# Patient Record
Sex: Male | Born: 1945 | Race: White | Hispanic: No | Marital: Married | Smoking: Never smoker
Health system: Southern US, Community
[De-identification: ages and names within clinical notes are randomized; demographics above are authoritative.]

## PROBLEM LIST (undated history)

## (undated) DIAGNOSIS — M109 Gout, unspecified: Secondary | ICD-10-CM

## (undated) DIAGNOSIS — N400 Enlarged prostate without lower urinary tract symptoms: Secondary | ICD-10-CM

## (undated) DIAGNOSIS — I10 Essential (primary) hypertension: Secondary | ICD-10-CM

## (undated) DIAGNOSIS — K219 Gastro-esophageal reflux disease without esophagitis: Secondary | ICD-10-CM

## (undated) DIAGNOSIS — D649 Anemia, unspecified: Secondary | ICD-10-CM

## (undated) DIAGNOSIS — E119 Type 2 diabetes mellitus without complications: Secondary | ICD-10-CM

## (undated) DIAGNOSIS — E785 Hyperlipidemia, unspecified: Secondary | ICD-10-CM

## (undated) HISTORY — DX: Essential (primary) hypertension: I10

## (undated) HISTORY — DX: Gout, unspecified: M10.9

## (undated) HISTORY — DX: Hyperlipidemia, unspecified: E78.5

## (undated) HISTORY — DX: Anemia, unspecified: D64.9

## (undated) HISTORY — DX: Gastro-esophageal reflux disease without esophagitis: K21.9

## (undated) HISTORY — DX: Type 2 diabetes mellitus without complications: E11.9

## (undated) HISTORY — DX: Benign prostatic hyperplasia without lower urinary tract symptoms: N40.0

---

## 2001-05-24 HISTORY — PX: CORONARY ARTERY BYPASS GRAFT: SHX141

## 2001-07-07 ENCOUNTER — Ambulatory Visit (HOSPITAL_COMMUNITY): Admission: RE | Admit: 2001-07-07 | Discharge: 2001-07-08 | Payer: Self-pay | Admitting: Cardiovascular Disease

## 2001-07-10 ENCOUNTER — Ambulatory Visit (HOSPITAL_COMMUNITY): Admission: RE | Admit: 2001-07-10 | Discharge: 2001-07-12 | Payer: Self-pay | Admitting: Cardiovascular Disease

## 2001-07-14 ENCOUNTER — Encounter: Payer: Self-pay | Admitting: *Deleted

## 2001-07-14 ENCOUNTER — Inpatient Hospital Stay (HOSPITAL_COMMUNITY): Admission: EM | Admit: 2001-07-14 | Discharge: 2001-07-18 | Payer: Self-pay | Admitting: *Deleted

## 2002-02-09 ENCOUNTER — Ambulatory Visit (HOSPITAL_COMMUNITY): Admission: RE | Admit: 2002-02-09 | Discharge: 2002-02-09 | Payer: Self-pay | Admitting: Cardiology

## 2002-02-13 ENCOUNTER — Ambulatory Visit (HOSPITAL_COMMUNITY): Admission: RE | Admit: 2002-02-13 | Discharge: 2002-02-13 | Payer: Self-pay | Admitting: Cardiology

## 2002-02-21 ENCOUNTER — Encounter: Payer: Self-pay | Admitting: Surgery

## 2002-02-23 ENCOUNTER — Encounter: Payer: Self-pay | Admitting: Surgery

## 2002-02-23 ENCOUNTER — Inpatient Hospital Stay (HOSPITAL_COMMUNITY): Admission: RE | Admit: 2002-02-23 | Discharge: 2002-02-28 | Payer: Self-pay | Admitting: Surgery

## 2002-02-24 ENCOUNTER — Encounter: Payer: Self-pay | Admitting: Surgery

## 2002-02-25 ENCOUNTER — Encounter: Payer: Self-pay | Admitting: Cardiothoracic Surgery

## 2002-02-26 ENCOUNTER — Encounter: Payer: Self-pay | Admitting: Cardiothoracic Surgery

## 2002-03-15 ENCOUNTER — Inpatient Hospital Stay (HOSPITAL_COMMUNITY): Admission: EM | Admit: 2002-03-15 | Discharge: 2002-03-17 | Payer: Self-pay | Admitting: Emergency Medicine

## 2002-03-15 ENCOUNTER — Encounter: Payer: Self-pay | Admitting: Cardiology

## 2002-03-17 ENCOUNTER — Encounter: Payer: Self-pay | Admitting: Cardiology

## 2002-04-14 ENCOUNTER — Encounter (INDEPENDENT_AMBULATORY_CARE_PROVIDER_SITE_OTHER): Payer: Self-pay | Admitting: *Deleted

## 2002-04-14 ENCOUNTER — Inpatient Hospital Stay (HOSPITAL_COMMUNITY): Admission: EM | Admit: 2002-04-14 | Discharge: 2002-04-19 | Payer: Self-pay

## 2002-04-14 ENCOUNTER — Encounter: Payer: Self-pay | Admitting: Internal Medicine

## 2002-04-16 ENCOUNTER — Encounter: Payer: Self-pay | Admitting: Internal Medicine

## 2002-04-16 ENCOUNTER — Encounter (INDEPENDENT_AMBULATORY_CARE_PROVIDER_SITE_OTHER): Payer: Self-pay | Admitting: *Deleted

## 2002-04-16 ENCOUNTER — Encounter: Payer: Self-pay | Admitting: Cardiology

## 2002-04-20 ENCOUNTER — Emergency Department (HOSPITAL_COMMUNITY): Admission: EM | Admit: 2002-04-20 | Discharge: 2002-04-20 | Payer: Self-pay | Admitting: Unknown Physician Specialty

## 2002-04-20 ENCOUNTER — Encounter: Payer: Self-pay | Admitting: Cardiology

## 2002-12-18 ENCOUNTER — Ambulatory Visit (HOSPITAL_COMMUNITY): Admission: RE | Admit: 2002-12-18 | Discharge: 2002-12-18 | Payer: Self-pay | Admitting: Unknown Physician Specialty

## 2002-12-18 ENCOUNTER — Encounter: Payer: Self-pay | Admitting: Unknown Physician Specialty

## 2003-02-05 ENCOUNTER — Encounter: Payer: Self-pay | Admitting: Unknown Physician Specialty

## 2003-02-05 ENCOUNTER — Ambulatory Visit (HOSPITAL_COMMUNITY): Admission: RE | Admit: 2003-02-05 | Discharge: 2003-02-05 | Payer: Self-pay | Admitting: Unknown Physician Specialty

## 2003-03-25 HISTORY — PX: COLONOSCOPY: SHX174

## 2003-10-01 ENCOUNTER — Ambulatory Visit (HOSPITAL_COMMUNITY): Admission: RE | Admit: 2003-10-01 | Discharge: 2003-10-01 | Payer: Self-pay | Admitting: Unknown Physician Specialty

## 2003-10-24 ENCOUNTER — Ambulatory Visit (HOSPITAL_COMMUNITY): Admission: RE | Admit: 2003-10-24 | Discharge: 2003-10-24 | Payer: Self-pay | Admitting: Unknown Physician Specialty

## 2003-10-29 ENCOUNTER — Ambulatory Visit (HOSPITAL_COMMUNITY): Admission: RE | Admit: 2003-10-29 | Discharge: 2003-10-29 | Payer: Self-pay | Admitting: Unknown Physician Specialty

## 2003-11-06 ENCOUNTER — Ambulatory Visit (HOSPITAL_COMMUNITY): Admission: RE | Admit: 2003-11-06 | Discharge: 2003-11-06 | Payer: Self-pay | Admitting: Unknown Physician Specialty

## 2004-01-21 ENCOUNTER — Ambulatory Visit (HOSPITAL_COMMUNITY): Admission: RE | Admit: 2004-01-21 | Discharge: 2004-01-21 | Payer: Self-pay | Admitting: Unknown Physician Specialty

## 2004-04-27 ENCOUNTER — Ambulatory Visit: Payer: Self-pay | Admitting: Family Medicine

## 2004-10-02 ENCOUNTER — Emergency Department (HOSPITAL_COMMUNITY): Admission: EM | Admit: 2004-10-02 | Discharge: 2004-10-02 | Payer: Self-pay | Admitting: Emergency Medicine

## 2004-10-08 ENCOUNTER — Ambulatory Visit: Payer: Self-pay | Admitting: Family Medicine

## 2004-11-19 IMAGING — CR DG TIBIA/FIBULA 2V*R*
4 series · 4 of 4 positions shown · non-contrast
Comparison: none

[view not recorded (1 of 4)]
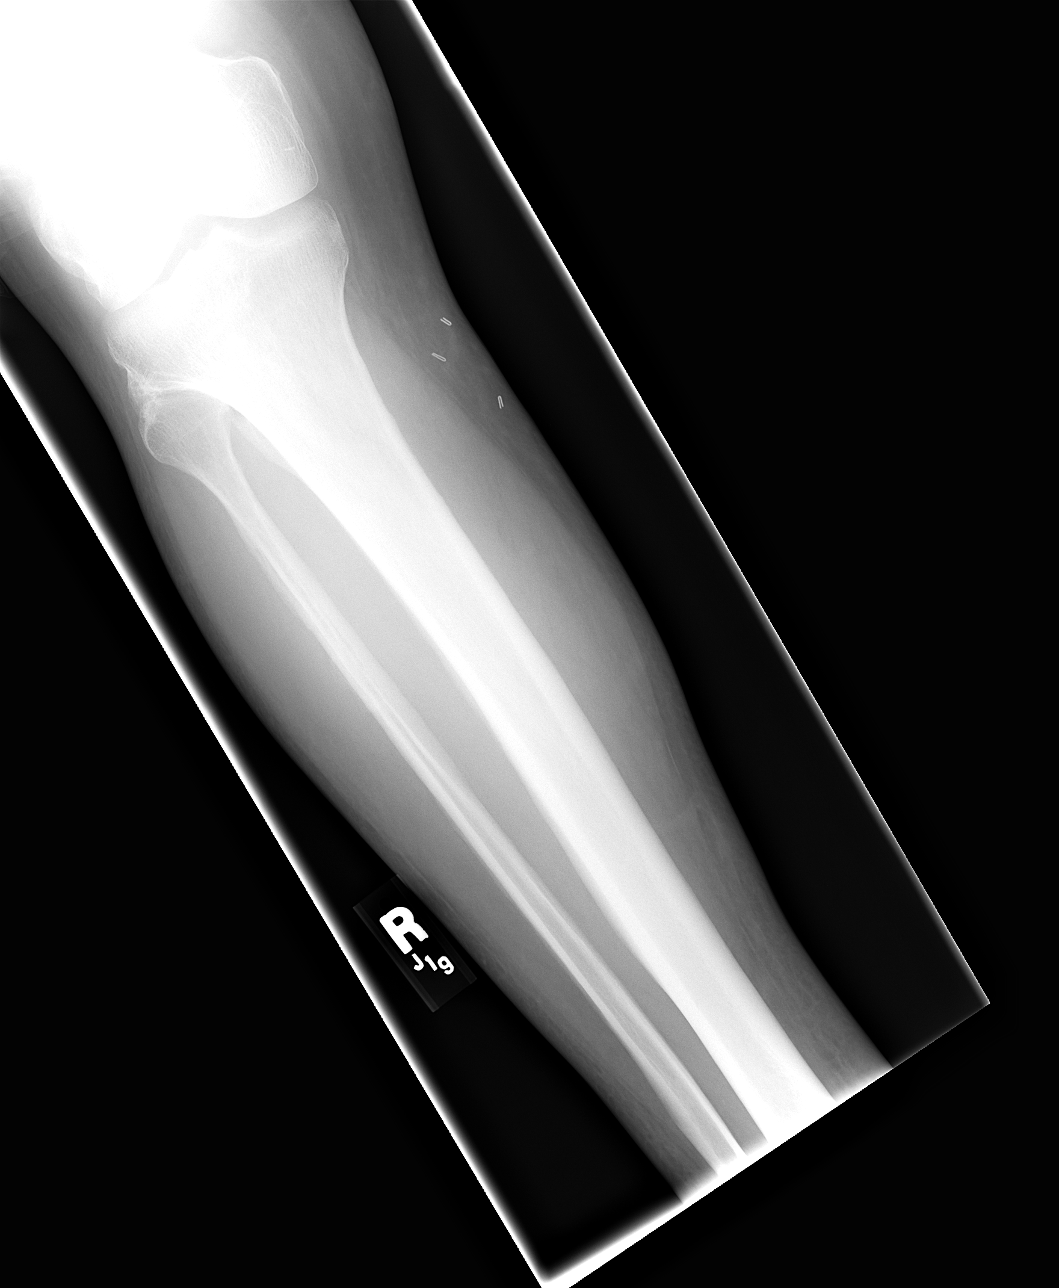

[view not recorded (2 of 4)]
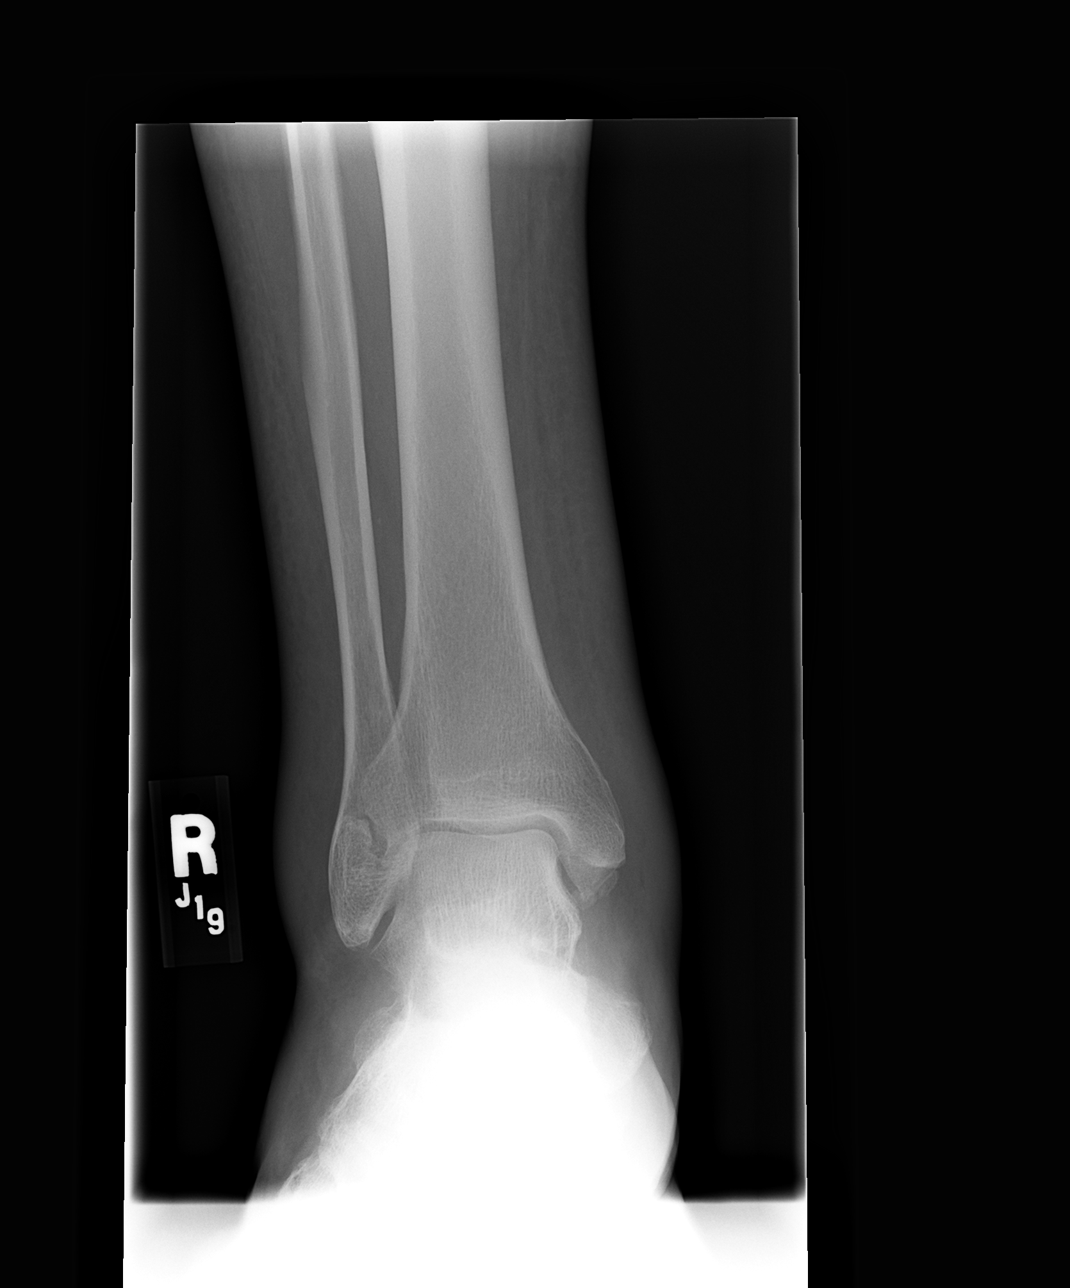

[view not recorded (3 of 4)]
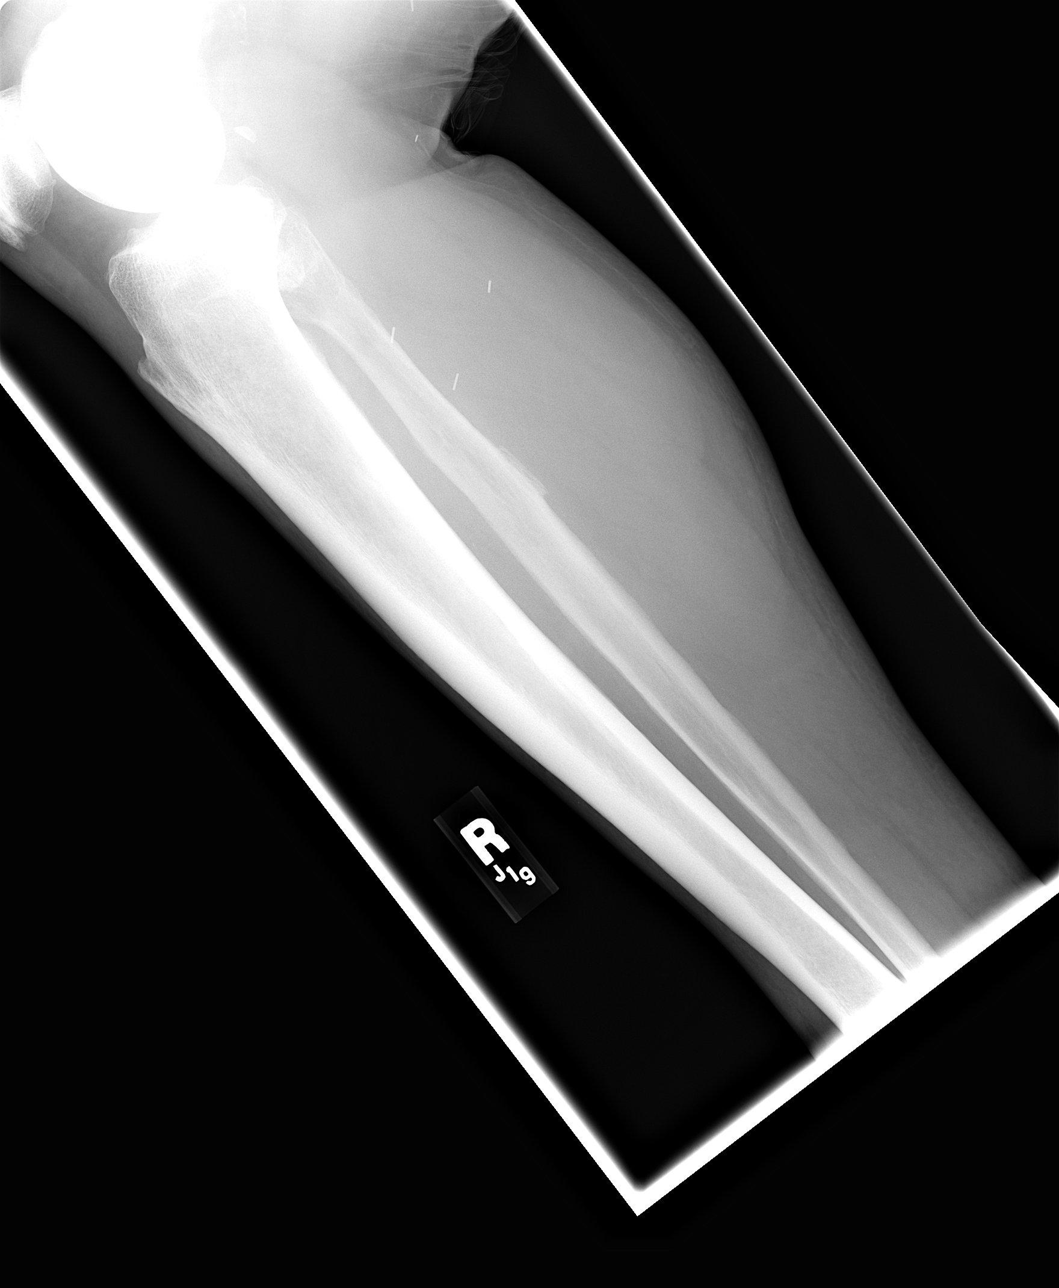

[view not recorded (4 of 4)]
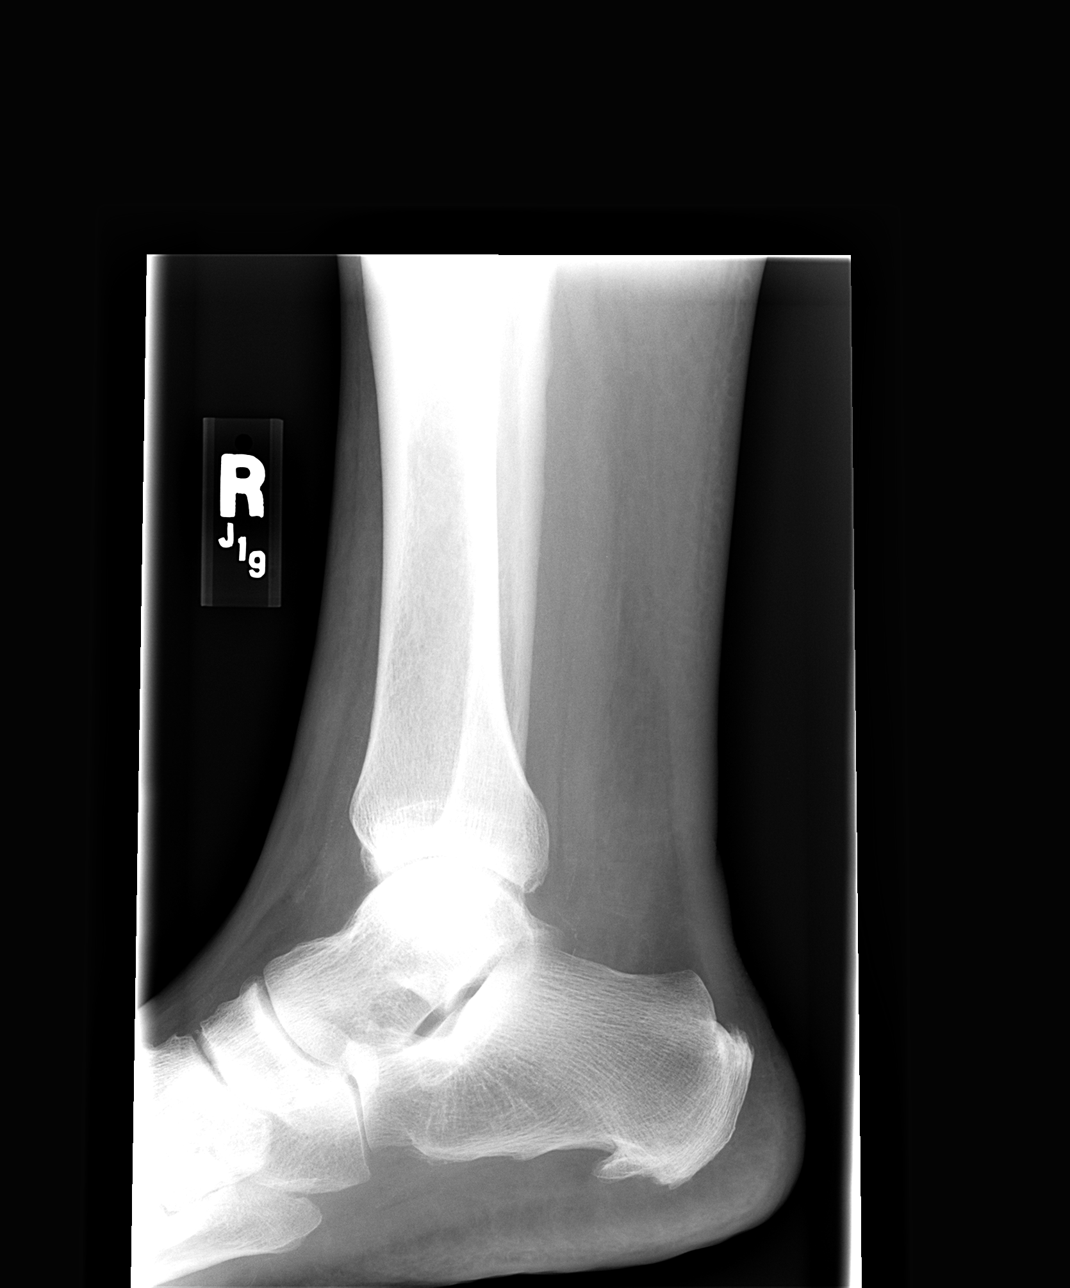

[4 of 4 positions shown; findings below may reference images not displayed]

<!--  IDXRADR:ADDEND:BEGIN -->Addendum Begins<!--  IDXRADR:ADDEND:INNER_BEGIN -->ADDENDUM:  I spoke with Dr. Tabaka by telephone today (11/20/03), and he confirmed that the patient?s injury was indeed in the proximal lower leg and that the soft tissue swelling about the ankle was secondary to this.  I do believe that the lucent line in the distal fibula is artifactual and probably related to a mock effect.  Dr. Urena

 <!--  IDXRADR:ADDEND:INNER_END -->Addendum Ends
<!--  IDXRADR:ADDEND:END -->Clinical Data:   Knot on medial /anterior lower leg following injury.
RIGHT TIBIA/FIBULA
Two views show no definite fracture or acute bony abnormality.  There is a lucency seen on the PA view at the tibiofibular junction, which I think is an artifact.  There is also a thin linear line above this area that is likely to be a vascular groove.  An occult fracture of the distal fibula cannot be excluded.  However, based on the history, it sounds like the injury was more proximal.  This needs to be clinically correlated.  There is generalized soft tissue swelling around the ankle.  
Heavy calcaneal spurring incidentally noted.
IMPRESSION
Soft tissue swelling-cannot rule out occult fracture of the distal fibula (doubt).

## 2005-05-11 ENCOUNTER — Ambulatory Visit: Payer: Self-pay | Admitting: Family Medicine

## 2005-05-13 ENCOUNTER — Ambulatory Visit: Payer: Self-pay | Admitting: Family Medicine

## 2006-06-28 ENCOUNTER — Ambulatory Visit: Payer: Self-pay | Admitting: Family Medicine

## 2006-12-27 ENCOUNTER — Ambulatory Visit (HOSPITAL_COMMUNITY): Admission: RE | Admit: 2006-12-27 | Discharge: 2006-12-27 | Payer: Self-pay | Admitting: Family Medicine

## 2008-08-22 ENCOUNTER — Emergency Department (HOSPITAL_COMMUNITY): Admission: EM | Admit: 2008-08-22 | Discharge: 2008-08-22 | Payer: Self-pay | Admitting: Emergency Medicine

## 2008-09-05 ENCOUNTER — Ambulatory Visit: Payer: Self-pay | Admitting: Surgery

## 2008-09-10 ENCOUNTER — Ambulatory Visit (HOSPITAL_COMMUNITY): Admission: RE | Admit: 2008-09-10 | Discharge: 2008-09-10 | Payer: Self-pay | Admitting: Surgery

## 2008-09-12 ENCOUNTER — Ambulatory Visit: Payer: Self-pay | Admitting: Surgery

## 2008-09-25 ENCOUNTER — Telehealth (INDEPENDENT_AMBULATORY_CARE_PROVIDER_SITE_OTHER): Payer: Self-pay | Admitting: *Deleted

## 2008-09-26 ENCOUNTER — Encounter: Payer: Self-pay | Admitting: Surgery

## 2008-09-26 ENCOUNTER — Inpatient Hospital Stay (HOSPITAL_COMMUNITY): Admission: RE | Admit: 2008-09-26 | Discharge: 2008-09-30 | Payer: Self-pay | Admitting: Surgery

## 2008-09-26 ENCOUNTER — Ambulatory Visit: Payer: Self-pay | Admitting: Surgery

## 2008-10-04 ENCOUNTER — Ambulatory Visit: Payer: Self-pay | Admitting: Surgery

## 2008-10-22 ENCOUNTER — Encounter: Admission: RE | Admit: 2008-10-22 | Discharge: 2008-10-22 | Payer: Self-pay | Admitting: Surgery

## 2008-10-22 ENCOUNTER — Ambulatory Visit: Payer: Self-pay | Admitting: Surgery

## 2009-04-29 ENCOUNTER — Encounter: Admission: RE | Admit: 2009-04-29 | Discharge: 2009-04-29 | Payer: Self-pay | Admitting: Surgery

## 2009-04-29 ENCOUNTER — Ambulatory Visit: Payer: Self-pay | Admitting: Surgery

## 2009-05-24 HISTORY — PX: LUNG LOBECTOMY: SHX167

## 2009-11-13 IMAGING — CR DG CHEST 2V
2 series · 2 of 2 positions shown · non-contrast
Comparison: Chest x-ray of 09/30/2008

CLINICAL DATA: History of left lung surgery 09/26/2008, follow-up

CHEST - 2 VIEW

[w chest pa]
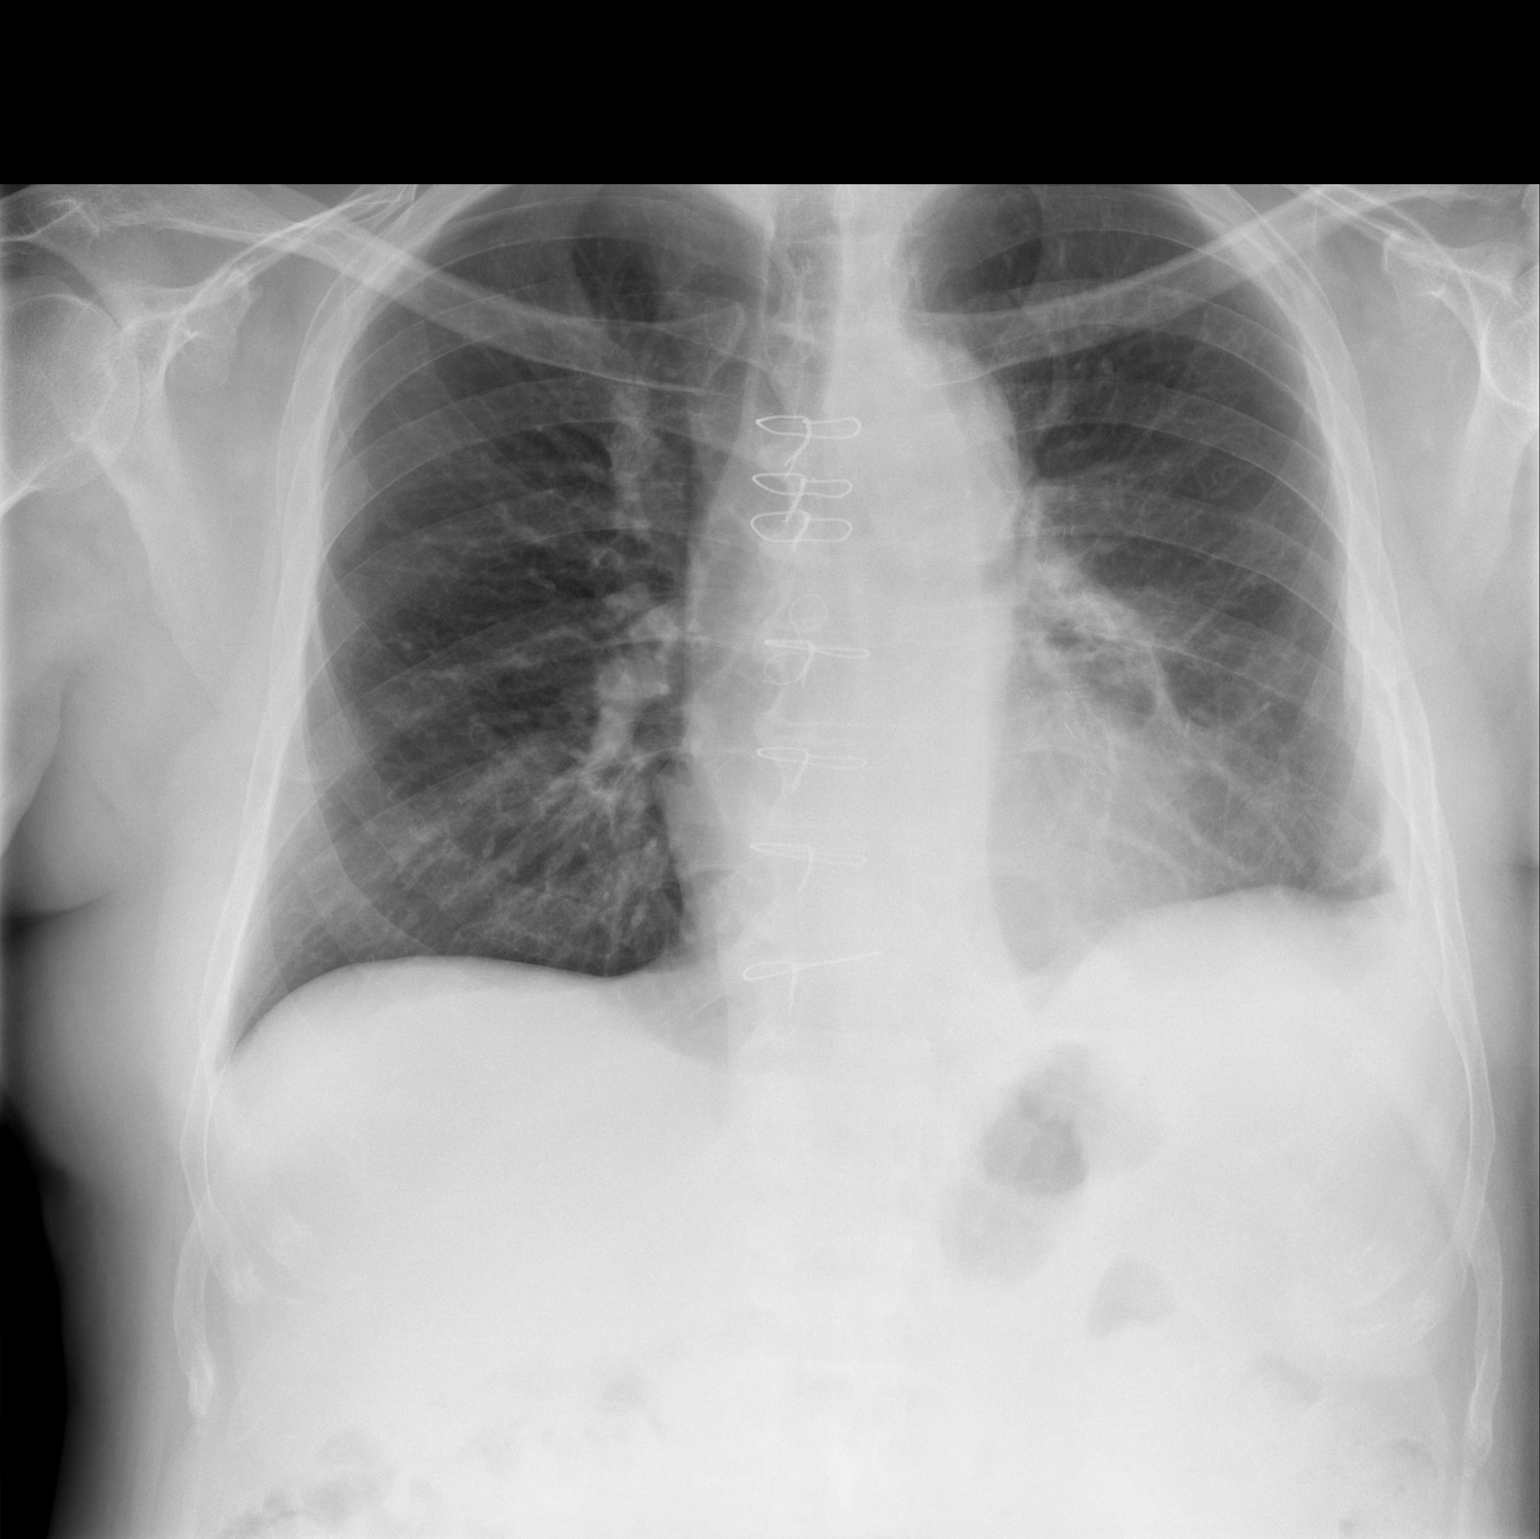

[w chest lat]
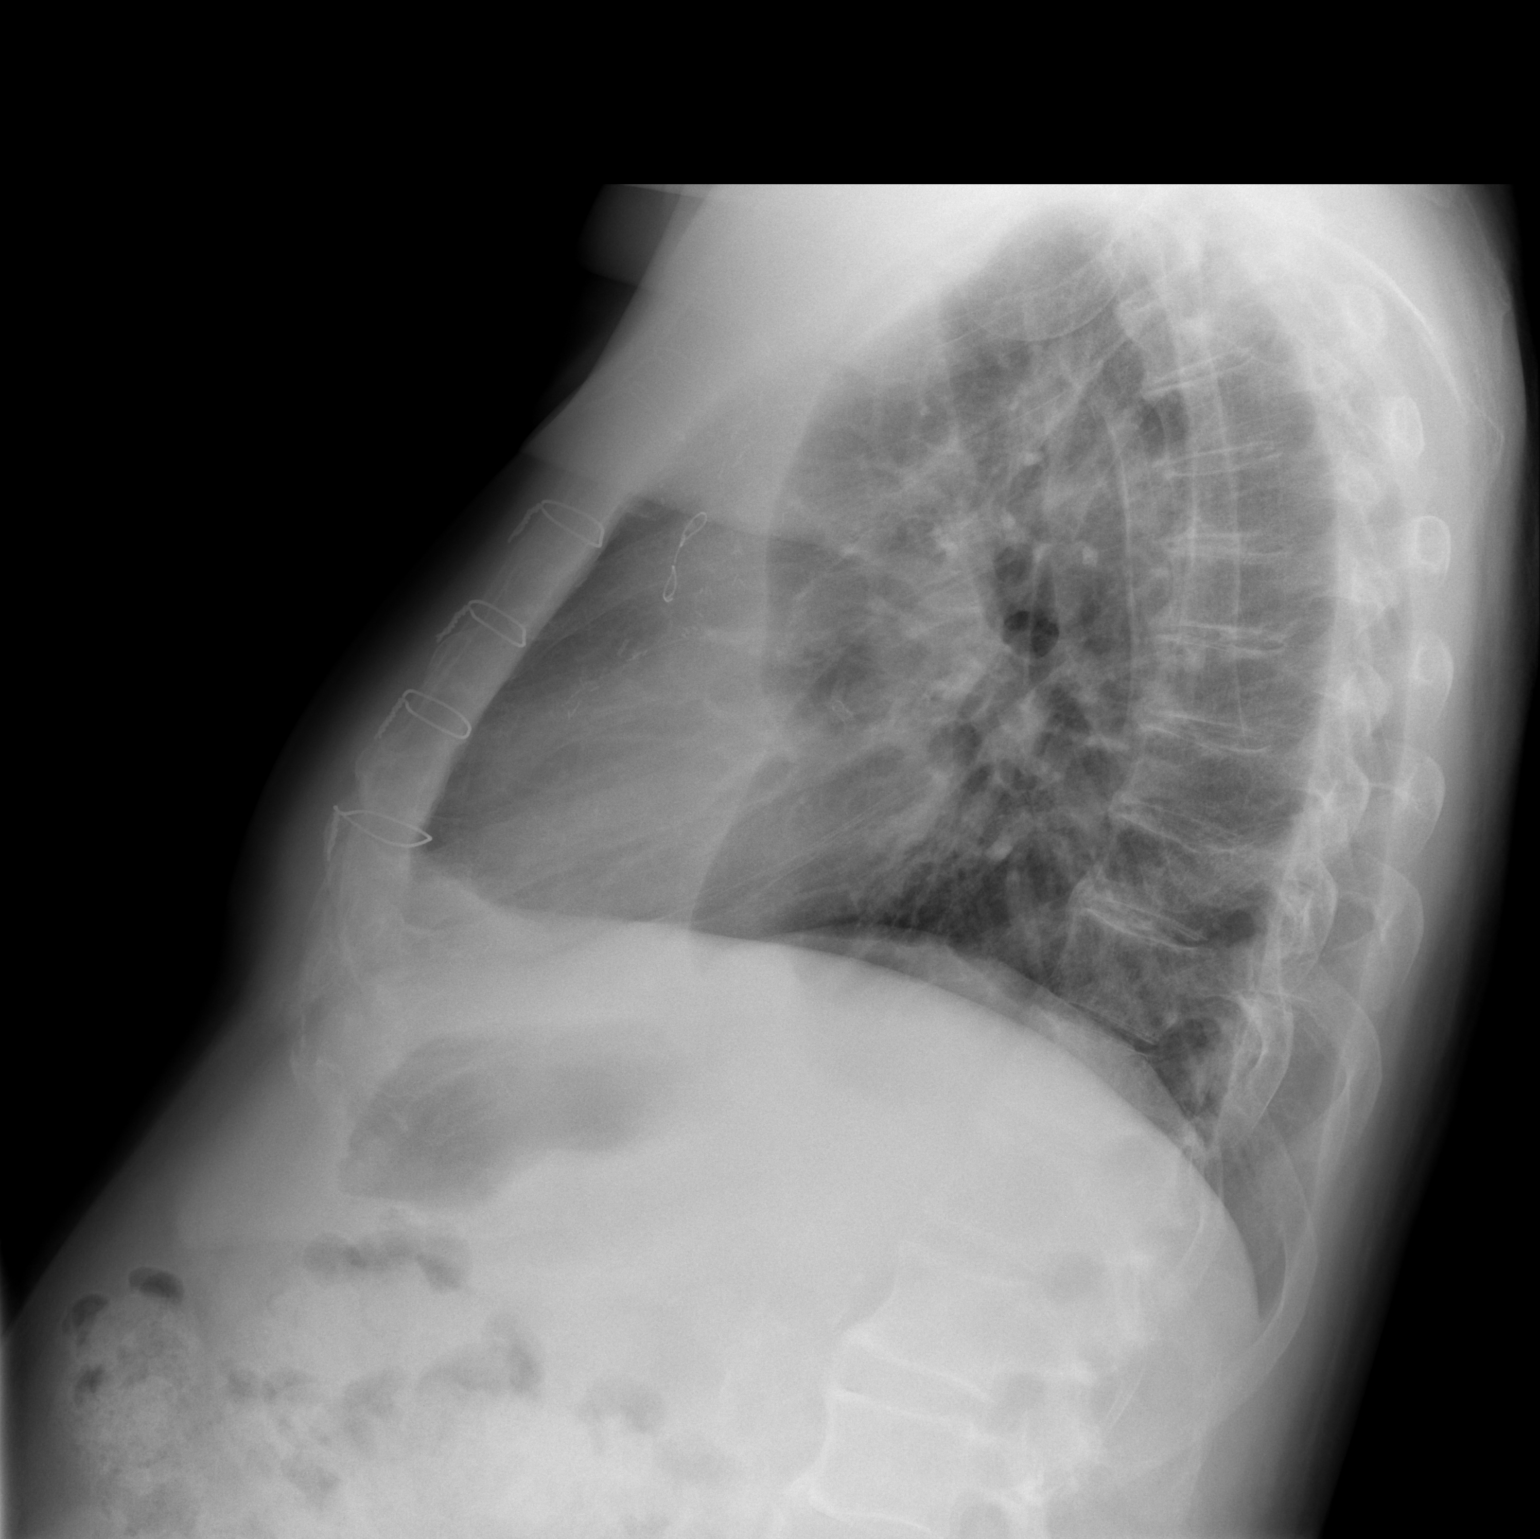

[2 of 2 positions shown; findings below may reference images not displayed]

FINDINGS: Aeration has improved with decreasing pleural and
parenchymal opacity at the left lung base.  The right lung is
clear.  Heart size is stable.  Median sternotomy sutures are noted.
IMPRESSION: Improved aeration on the left with minimal residual pleural and
parenchymal opacity.

## 2010-04-28 ENCOUNTER — Ambulatory Visit: Payer: Self-pay | Admitting: Surgery

## 2010-04-28 ENCOUNTER — Encounter: Admission: RE | Admit: 2010-04-28 | Discharge: 2010-04-28 | Payer: Self-pay | Admitting: Surgery

## 2010-06-13 ENCOUNTER — Encounter: Payer: Self-pay | Admitting: Emergency Medicine

## 2010-09-01 LAB — COMPREHENSIVE METABOLIC PANEL
BUN: 24 mg/dL — ABNORMAL HIGH (ref 6–23)
CO2: 24 mEq/L (ref 19–32)
Chloride: 109 mEq/L (ref 96–112)
Creatinine, Ser: 1.17 mg/dL (ref 0.4–1.5)
GFR calc non Af Amer: >60 mL/min (ref >60)
Glucose, Bld: 97 mg/dL (ref 70–99)

## 2010-09-01 LAB — CBC
Hemoglobin: 14.1 g/dL (ref 13.0–17.0)
MCHC: 34.5 g/dL (ref 30.0–36.0)
MCHC: 34.8 g/dL (ref 30.0–36.0)
Platelets: 139 10*3/uL — ABNORMAL LOW (ref 150–400)
Platelets: 152 10*3/uL (ref 150–400)
RDW: 13.8 % (ref 11.5–15.5)
RDW: 13.9 % (ref 11.5–15.5)

## 2010-09-01 LAB — BASIC METABOLIC PANEL
BUN: 24 mg/dL — ABNORMAL HIGH (ref 6–23)
CO2: 25 mEq/L (ref 19–32)
CO2: 26 mEq/L (ref 19–32)
Calcium: 8.2 mg/dL — ABNORMAL LOW (ref 8.4–10.5)
Chloride: 105 mEq/L (ref 96–112)
Creatinine, Ser: 1.16 mg/dL (ref 0.4–1.5)
Creatinine, Ser: 1.21 mg/dL (ref 0.4–1.5)
GFR calc Af Amer: >60 mL/min (ref >60)
GFR calc Af Amer: >60 mL/min (ref >60)
GFR calc non Af Amer: >60 mL/min (ref >60)
Glucose, Bld: 153 mg/dL — ABNORMAL HIGH (ref 70–99)
Glucose, Bld: 93 mg/dL (ref 70–99)
Sodium: 140 mEq/L (ref 135–145)

## 2010-09-01 LAB — BLOOD GAS, ARTERIAL
Acid-base deficit: 1.7 mmol/L (ref 0.0–2.0)
Drawn by: 206361
FIO2: 0.21 %
O2 Saturation: 96.7 %
Patient temperature: 98.6
TCO2: 23.6 mmol/L (ref 0–100)
pH, Arterial: 7.393 (ref 7.350–7.450)
pO2, Arterial: 87.6 mmHg (ref 80.0–100.0)

## 2010-09-01 LAB — GLUCOSE, CAPILLARY
Glucose-Capillary: 101 mg/dL — ABNORMAL HIGH (ref 70–99)
Glucose-Capillary: 107 mg/dL — ABNORMAL HIGH (ref 70–99)
Glucose-Capillary: 134 mg/dL — ABNORMAL HIGH (ref 70–99)
Glucose-Capillary: 140 mg/dL — ABNORMAL HIGH (ref 70–99)
Glucose-Capillary: 157 mg/dL — ABNORMAL HIGH (ref 70–99)
Glucose-Capillary: 184 mg/dL — ABNORMAL HIGH (ref 70–99)
Glucose-Capillary: 191 mg/dL — ABNORMAL HIGH (ref 70–99)
Glucose-Capillary: 228 mg/dL — ABNORMAL HIGH (ref 70–99)
Glucose-Capillary: 283 mg/dL — ABNORMAL HIGH (ref 70–99)
Glucose-Capillary: 75 mg/dL (ref 70–99)
Glucose-Capillary: 96 mg/dL (ref 70–99)

## 2010-09-01 LAB — PROTIME-INR: Prothrombin Time: 13.3 seconds (ref 11.6–15.2)

## 2010-09-01 LAB — URINALYSIS, ROUTINE W REFLEX MICROSCOPIC
Bilirubin Urine: NEGATIVE
Nitrite: NEGATIVE
Specific Gravity, Urine: 1.013 (ref 1.005–1.030)
Urobilinogen, UA: 0.2 mg/dL (ref 0.0–1.0)
pH: 5.5 (ref 5.0–8.0)

## 2010-09-01 LAB — TYPE AND SCREEN: Antibody Screen: NEGATIVE

## 2010-09-02 LAB — DIFFERENTIAL
Basophils Absolute: 0 10*3/uL (ref 0.0–0.1)
Eosinophils Absolute: 0.1 10*3/uL (ref 0.0–0.7)
Eosinophils Relative: 2 % (ref 0–5)
Monocytes Absolute: 0.3 10*3/uL (ref 0.1–1.0)

## 2010-09-02 LAB — CBC
HCT: 40.2 % (ref 39.0–52.0)
MCV: 87.4 fL (ref 78.0–100.0)
Platelets: 130 10*3/uL — ABNORMAL LOW (ref 150–400)
RDW: 13.9 % (ref 11.5–15.5)

## 2010-09-02 LAB — GLUCOSE, CAPILLARY: Glucose-Capillary: 148 mg/dL — ABNORMAL HIGH (ref 70–99)

## 2010-09-02 LAB — BASIC METABOLIC PANEL
BUN: 23 mg/dL (ref 6–23)
CO2: 25 mEq/L (ref 19–32)
Chloride: 104 mEq/L (ref 96–112)
Glucose, Bld: 104 mg/dL — ABNORMAL HIGH (ref 70–99)
Potassium: 4.2 mEq/L (ref 3.5–5.1)

## 2010-09-02 LAB — URINALYSIS, ROUTINE W REFLEX MICROSCOPIC
Bilirubin Urine: NEGATIVE
Glucose, UA: NEGATIVE mg/dL
Hgb urine dipstick: NEGATIVE
Ketones, ur: NEGATIVE mg/dL
pH: 6 (ref 5.0–8.0)

## 2010-09-02 LAB — POCT CARDIAC MARKERS
Myoglobin, poc: 64.4 ng/mL (ref 12–200)
Myoglobin, poc: 82.6 ng/mL (ref 12–200)
Troponin i, poc: <0.05 ng/mL (ref 0.00–0.09)

## 2010-09-02 LAB — POCT I-STAT, CHEM 8
Calcium, Ion: 1.17 mmol/L (ref 1.12–1.32)
Creatinine, Ser: 1.4 mg/dL (ref 0.4–1.5)
Hemoglobin: 14.6 g/dL (ref 13.0–17.0)
Sodium: 140 mEq/L (ref 135–145)
TCO2: 26 mmol/L (ref 0–100)

## 2010-10-06 NOTE — Op Note (Signed)
[name], PALMERO NO.:  [id number]   MEDICAL RECORD NO.:  [id number]          PATIENT TYPE:  INP   LOCATION:  2313                         FACILITY:  [facility]   PHYSICIAN:  Evelene Croon, M.D.     DATE OF BIRTH:  03-22-1946   DATE OF PROCEDURE:  09/26/2008  DATE OF DISCHARGE:                               OPERATIVE REPORT   PREOPERATIVE DIAGNOSIS:  Left upper lobe lung mass.   POSTOPERATIVE DIAGNOSIS:  Left upper lobe lung mass.   PROCEDURES:  1. Left thoracotomy.  2. Wedge resection of left upper lobe lung mass.   SURGEON:  Evelene Croon, MD   ASSISTANT:  Rowe Clack, PA-C   ANESTHESIA:  General endotracheal.   CLINICAL HISTORY:  This patient is a 65 year old gentleman who I had  previously performed coronary artery bypass graft surgery x5 in October  2003.  He has done well since then.  He recently developed some nausea  and dizziness and was seen in the [facility] Emergency Room where a CT  scan of the head was negative.  Chest x-ray showed a suggestion of a  left upper lobe lung mass.  He subsequently had a CT scan of the chest  which showed a 2 x 2.8 cm mass in the anterior segment of left upper  lobe with some involvement of the underlying pleura.  There was no  evidence of chest wall invasion.  This was suspicious for a primary  neoplasm.  There are no pathologically enlarged mediastinal or hilar  lymph nodes.  His nausea and dizziness completely resolved.  He was  referred to my office and we obtained a PET scan, which showed that the  left upper lobe lung lesion to have a SUV of 2.7.  It was felt this is  either inflammatory or possibly a bronchoalveolar cell carcinoma.  He  had PFT which showed mild reduction of his FEV-1 of 2.66 which was 73%  of predicted and reduction in his FVC of 3.2 to 3.39 which was 70% of  predicted.  Diffusion capacity was 97%.  After review of all of his  studies, I discussed the options with him including obtaining  a  percutaneous needle biopsy, I continued observation with followup CT  scan or surgical resection.  He decided that the best option for him  would be to proceed with surgical resection of this lesion.  I discussed  the operative procedure with he and his wife including alternatives,  benefits, and risks including but not limited to bleeding, blood  transfusion, injury to the lung, injury to the left internal mammary  graft which was felt to be lying adjacent to this area, prolonged air  leak, and space problem.  He understood all this and agreed to proceed.   OPERATIVE PROCEDURE:  The patient was taken to the operative room and  placed on table in a supine position.  He had been seen in the holding  area and the left side of the chest was signed by me after confirming  that was the proper patient, proper operative side,  and proper  operation.  After induction of general endotracheal anesthesia,  pneumatic compression devices were placed on both lower legs.  Foley  catheter was placed in bladder.  The patient was then turned into the  right lateral decubitus position with left side up.  The left side of  the chest was prepped with Betadine soap solution and draped in usual  sterile manner.  A time-out was taken and the proper patient, proper  operative side, proper operation were confirmed with nursing and  anesthesia staff.  Then, the left chest was opened through a lateral  thoracotomy incision.  Initially, we tried to do a muscle-sparing  incision and entered the pleural space through the fourth intercostal  space.  Unfortunately, the left upper lobe lung was firmly adherent to  the apex of the chest as well as to the mediastinal pleura over the area  where the left internal mammary pedicle was entering the pericardium.  This area was very difficult to see through this limited incision; and  therefore, I felt it was necessary to extend the incision by dividing  the latissimus and  serratus muscles.  This provided good exposure of the  area.  Palpation of the left upper lobe showed that there was some  thickness in the area in question on CT scan, but no definite mass.  There was overlying scarring.  This area was firmly adherent to the  mediastinal pleura.  Then, the lung was dissected off the mediastinal  pleura and apex of the chest using sharp scissors dissection.  The left  internal mammary pedicle was not injured.  After mobilizing the left  upper lobe, there were no further lesions visible or palpated.  Left  lower lobe appeared unremarkable.  I felt the best option would be to  perform a wedge resection of the area in question which was essentially  the lingular segment of the left upper lobe.  This was performed using  linear cutting staplers.  All the visibly disease lung was removed and  sent to pathology for permanent section.  The staple line was lightly  coated with CoSeal.  The left upper lobe was inflated, and there were no  visible air leaks.  There was complete hemostasis.   Then, the On-Q pain pump was inserted.  A small stab incision was made  in the posterior axillary line below the thoracotomy incision.  The  introducer sheath and trocar was inserted and advanced in a subpleural  location posterior to the thoracotomy incision and one interspace above  it.  The introducer was removed, and the catheter was placed through the  sheath.  The peel-away sheath was removed and the catheter fixed to the  skin with silk suture.  It was injected with 5 mL of 0.5% Marcaine  solution.  Then, two 28-French chest tubes were placed through separate  stab incisions and one positioned posteriorly and one anteriorly.  The  ribs were then reapproximated with #2 Vicryl pericostal sutures.  The  muscles reapproximated with continuous #1 Vicryl suture.  Subcutaneous  tissue was closed with continuous 2-0 Vicryl and the skin with 3-0  Vicryl subcuticular closure.   Sponge, needle, and instrument counts were  correct according to the scrub nurse.  Dry sterile dressing applied over  the incision and around the chest tubes, which were hooked to Pleur-Evac  suction.  The patient was then turned to supine position, extubated, and  transported to the postanesthesia care unit in satisfactory and stable  condition.      Evelene Croon, M.D.  Electronically Signed     BB/MEDQ  D:  09/26/2008  T:  09/27/2008  Job:  [id number]

## 2010-10-06 NOTE — Assessment & Plan Note (Signed)
OFFICE VISIT   [name], [name]  DOB:  [birth year]                                        September 12, 2008  CHART #:  [id number]   The patient returned today to discuss results of his PET scan and PFTs.  Please see my new patient consultation of September 05, 2008, for his full  history and physical.  He underwent a PET scan on September 10, 2008, which  showed mild metabolic activity within the left upper lobe lung mass with  an SUV of 2.7.  There were no other areas of hypermetabolic uptake.  There were no hypermetabolic lymph nodes.  It was felt that this lesion  could be benign, although with a low SUV of 2.7, it is not possible to  rule out bronchoalveolar cell carcinoma.  He also had PFTs which showed  a mild reduction of his FEV-1 2.66, which is 73% predicted and reduction  of his FVC of 3.39 which is 70% predicted.  The ratio is 105%.  His  diffusion capacity was corrected to 97% predicted.  These pulmonary  function tests are certainly good enough for him to tolerate lung  resection.  I reviewed the results with him and his mother.  I discussed  the options for treatment at this point.  I think there are 3 options.  The first is proceeding with surgical resection of the lesion through  left thoracotomy probably doing a wedge resection or lingular resection.  This would allow a firm diagnosis of the lesion and definitive  treatment.  The second option would be to perform a percutaneous CT-  guided needle biopsy to try to obtain a firm diagnosis, although this  may not give [place] a definite diagnosis in which case we still have the  concern about the possibility of cancer.  The third option would be to  continue following this with a CT scan.  I told him that I would  recommend the first option of proceeding ahead with surgical resection  since he is in good overall condition, has acceptable PFTs.  I am  concerned about this lesion with a low level of hypermetabolic  activity  and his strong family history of lung cancer.  He has had heart surgery  in the past, has a patent left internal mammary graft with likely some  adhesions in the left pleural space.  I suspect the lung to be somewhat  adhesed to the mediastinal pleura.  This lesion will be much easier to  resect while it is small, than we wait until it is enlarged.  I  discussed the benefits and risks of all 3 options with he and his mother  and he is in agreement with proceeding with surgical resection.  We will  plan to do this on Wednesday Sep 25, 2008.  I discussed the operative  procedure including benefits and risks including but not limited to  bleeding, blood transfusion, infection, prolonged air leak from the  lung, injury to left internal  mammary artery, and the possibility that this may not be a cancer.  He  understands all this and agrees to proceed.   Evelene Croon, M.D.  Electronically Signed   BB/MEDQ  D:  09/12/2008  T:  09/13/2008  Job:  [id number]   cc:   Delaney Meigs, M.D.

## 2010-10-06 NOTE — Assessment & Plan Note (Signed)
OFFICE VISIT   Wesley, Chavez  DOB:  [birth year]                                        April 29, 2009  CHART #:  [id number]   HISTORY OF PRESENT ILLNESS:  The patient has returned today for followup  status post left thoracotomy and wedge resection of left upper lobe lung  mass on Sep 26, 2008.  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  Since I last saw  him on October 22, 2008, he has been doing well.  He currently has no  complaints, and he has returned to normal activity.   PHYSICAL EXAMINATION:  Blood pressure 150/80, pulse 66 and regular,  respiratory rate is 18 and unlabored.  Oxygen saturation on room air is  98%.  He looks well.  The thoracotomy incision is well healed.  His  lungs are clear.  Cardiac exam shows a regular rate and rhythm with  normal heart sounds.   DIAGNOSTIC TESTS:  Chest x-ray today shows clear lung fields and no  pleural effusions.   IMPRESSION:  The patient is doing well following his surgery.  I plan to  see him back in 1 year with a repeat chest x-ray.   Evelene Croon, M.D.  Electronically Signed   BB/MEDQ  D:  04/29/2009  T:  04/30/2009  Job:  [id number]

## 2010-10-06 NOTE — Assessment & Plan Note (Signed)
OFFICE VISIT   [name], [name]  DOB:  1946/02/08                                        October 22, 2008  CHART #:  [id number]   The patient returned today for followup, status post left thoracotomy  and wedge resection of a left upper lobe lung mass on Sep 26, 2008.  The  final pathology showed hemangiomatosis proliferation with no malignant  features identified.  He has done well since surgery.  His pain is well  controlled and he is increasing his activity.  He has already started  driving again.   PHYSICAL EXAMINATION:  VITAL SIGNS:  Blood pressure 145/72, his pulse is  61 and regular, respiratory rate is 18 and unlabored.  Oxygen saturation  on room air is 97%.  GENERAL:  He looks well.  CARDIAC:  Regular rate and rhythm with normal heart sounds.  LUNGS:  Clear.  CHEST:  The chest incision is healing well.  Chest tube sutures have  been removed and the sites are healing well.   Followup chest x-ray today shows improved aeration of the left lung with  minimal pleural and parenchymal opacity.   IMPRESSION:  Overall, the patient is recovering well following his  surgery.  I told him he could increase his activity, but should refrain  from lifting anything heavier than 10 pounds for about 8 weeks following  surgery.  I will plan to see him back in 6 months with a repeat chest x-  ray.   Evelene Croon, M.D.  Electronically Signed   BB/MEDQ  D:  10/22/2008  T:  10/23/2008  Job:  [id number]   cc:   Delaney Meigs, M.D.

## 2010-10-06 NOTE — Discharge Summary (Signed)
[name], [name] NO.:  [id number]   MEDICAL RECORD NO.:  [id number]          PATIENT TYPE:  INP   LOCATION:  2020                         FACILITY:  [facility]   PHYSICIAN:  Evelene Croon, M.D.     DATE OF BIRTH:  April 17, 1946   DATE OF ADMISSION:  09/26/2008  DATE OF DISCHARGE:                               DISCHARGE SUMMARY   FINAL DIAGNOSIS:  Left upper lobe lung mass, no malignant features  identified, positive hemangiomatous proliferation with pleural fibrosis.   SECONDARY DIAGNOSES:  1. Coronary artery disease, status post coronary artery bypass      grafting x5 on February 23, 2002.  2. History of diabetes mellitus.  3. Hypertension.  4. History of gastroesophageal reflux disease.  5. Status post percutaneous transluminal coronary angioplasty and      stent placement in February 2003, prior to coronary artery bypass      graft.  6. Status post non-Q-wave myocardial infarction.   IN-HOSPITAL OPERATIONS AND PROCEDURES:  Left thoracotomy with wedge  resection, left upper lobe lung mass.   HISTORY AND PHYSICAL AND HOSPITAL COURSE:  The patient is a 65 year old  gentleman, who previously underwent coronary artery bypass grafting x5  in October 2003.  The patient has done well since then.  He recently  developed some nausea and dizziness and was seen in the [facility]  Emergency Room where CT scan of the head was negative.  Chest x-ray done  showed suggestion of a left upper lobe lung mass.  The patient  subsequently underwent CT scan of the chest, which showed a 2 x 2.8 cm  mass in the anterior segment of the left upper lobe with some  involvement of underlying pleura.  There is no evidence of chest wall  lesion.  The patient's nausea and dizziness completely resolved.  He  underwent a PET scan, which showed that the left upper lobe lung lesion  had an SUV of 2.7.  It was felt this was either inflammatory possible  bronchoalveolar cell carcinoma.  The patient  underwent PFTs, which  showed mild reduction of his FEV1 of 2.66, 75% predicted, and reduction  of his FVC of 3.2 to 2.39, which is 70% predicted.  Diffusion capacity  was 97%.  The patient was seen and evaluated by Dr. Laneta Simmers.  Dr. Laneta Simmers  discussed with the patient regarding resection of this mass.  He  discussed risks and benefits with the patient.  The patient acknowledged  understanding and agreed to proceed.  Surgery was scheduled for Sep 26, 2008.   For details of the patient's past medical history and physical exam,  please see dictated H and P.   The patient was taken to the operating room on Sep 26, 2008, where he  underwent left thoracotomy, wedge resection of left upper lobe lung  mass.  This was negative for malignancy, positive for hemangiomatous  proliferation with pleural fibrosis.  The patient tolerated this  procedure well and was transferred to the Intensive Care Unit in stable  condition.  The patient's postoperative course was pretty much  unremarkable.  Daily chest x-rays obtained.  These remained stable.  On  postop day #1, no air leak was noted.  Posterior chest tube was  discontinued.  Chest x-rays remained stable.  There is a questionable  air leak on postop day #2 __________, same postop day #3.  Chest x-ray  showed no sign of significant pneumothorax.  Plan is to probably  discontinue anterior chest tube today with followup chest x-ray in the  a.m.  The patient was encouraged to continue using his incentive  spirometer.  He was weaned off oxygen, sating greater than 90% on room  air.  The patient's vital signs were followed during his hospital  course.  He remained afebrile.  The patient remained in normal sinus  rhythm.  Blood pressure is stable.  He is diabetic and blood sugars are  followed closely.  He was started on Lantus insulin postoperatively and  then restarted on his glyburide/metformin.  Blood sugars continued to be  followed.  Question continue  the patient on Lantus insulin at the time  of discharge.  Postoperatively, the patient was up ambulating well with  assistance.  He was tolerating diet well.  No nausea or vomiting noted.  All incisions remained clean, dry, and intact, and healing well.  The  patient was transferred to 2000 on postop day #1.   On May 9, the patient is noted to be afebrile.  Vital signs are stable.  He is sating greater than 90% on room air.   Most recent lab work showed sodium of 138, potassium 3.9, chloride of  105, bicarbonate of 26, BUN of 24, creatinine 1.21, and glucose of 93.  The patient is felt to be ready for discharge home in the next 24-48  hours pending he remained stable.   FOLLOWUP APPOINTMENTS:  A follow up appointment was arranged with Dr.  Laneta Simmers for October 22, 2008, at 12:30 p.m.  The patient will need to obtain  PA and lateral chest x-ray 30 minutes prior to his appointment.  An  appointment has been arranged with nurse for suture removal for Oct 04, 2008, at 10 a.m.   ACTIVITY:  The patient is instructed no driving until released to do so,  no lifting over 10 pounds.  He is told to ambulate 3-4 times per day,  progress as tolerated, and continue his breathing exercises.   INCISIONAL CARE:  The patient is told to shower, washing his incisions  using soap and water.  He is contact the office if he develops any  drainage or opening from any of his incision sites.   DISCHARGE MEDICATIONS:  1. Flomax 0.4 mg daily.  2. Ambien 10 mg at night.  3. Protonix 40 mg daily.  4. Bystolic 5 mg daily.  5. Ferrous sulfate 325 mg daily.  6. Lisinopril 10 mg daily.  7. Simvastatin 20 mg daily.  8. Multivitamins 1 a day daily.  9. Enteric-coated aspirin 325 mg daily.  10.Fish oil 1200 mg b.i.d.  11.Darvocet-N 100 one to two tabs q.4-6 h. p.r.n. pain.  12.Glyburide/metformin 2.5/500 mg b.i.d.  13.Questionable discharge home on Lantus insulin.  It will be decided      at the time of  discharge.       Sol Blazing, PA      Evelene Croon, M.D.  Electronically Signed    KMD/MEDQ  D:  09/29/2008  T:  09/29/2008  Job:  [id number]   cc:   Delaney Meigs, M.D.

## 2010-10-06 NOTE — Consult Note (Signed)
NEW PATIENT CONSULTATION   [name], [name]  DOB:  1945-11-12                                        September 05, 2008  CHART #:  [id number]   REASON FOR CONSULTATION:  Left upper lobe lung mass.   CLINICAL HISTORY:  I was asked by Dr. Lysbeth Galas to evaluate the patient for  a left upper lobe lung mass.  He is a 65 year old gentleman, who I have  seen previously after having performed coronary artery bypass graft  surgery x5 on February 23, 2002.  He has done well since then.  His  cardiologist is Dr. Juanda Chance, but he has not seen him in several years.  The patient reports that he developed some nausea and dizziness a few  weeks ago.  He was seen by Dr. Lysbeth Galas and was given some Phenergan  according to the patient.  He continued to have these symptoms and  presented to the [facility].  He underwent a CT scan of  the head, which was negative.  He had a chest x-ray that showed a left  upper lobe lung mass.  He subsequently had a CT scan of the chest that  showed a 2 x 2.8 cm mass in the anterior segment of the left upper lobe  with some involvement of the underlying pleura, but no frank chest wall  invasion.  This was suspicious for primary neoplasm.  There are no  pathologically enlarged mediastinal or hilar lymph nodes.  All of his  laboratory work was negative.  He had cardiac enzymes that were  negative.  The patient said that when he went home, he started taking  some Pepto-Bismol and eating some chicken soup and this improved his  symptoms and they completely resolved over the next few days.  Since  then, he has had no further complaints.  He said he is feeling back to  his baseline at this time.   REVIEW OF SYSTEMS:  His review of systems is as follows.  General:  He  denies any fever or chills.  He has had no recent weight changes.  He  denies fatigue.  Eyes:  Negative.  ENT:  Negative.  Endocrine:  He  denies hypothyroidism.  He does have diabetes since  age 16.  Cardiovascular:  He denies any chest pain or pressure.  He denies any  PND or orthopnea.  No exertional dyspnea.  Denies palpitations.  He has  had a history of chronic lower extremity edema bilaterally since his  bypass surgery, but this has improved over time.  Respiratory:  Denies  cough, sputum production, and hemoptysis.  GI:  He has had no nausea or  vomiting.  He denies melena and bright red blood per rectum.  GU:  Denies dysuria and hematuria.  Musculoskeletal:  He denies arthralgias  and myalgias.  Neurological:  He denies any focal weakness or numbness.  He has had no further dizziness.  He denies any syncope.  He has never  had a TIA or stroke.  Hematological:  Negative.   ALLERGIES:  Codeine, which causes hives.   His medications are aspirin 325 mg daily; promethazine 25 mg p.r.n.,  which she is not taking any more; Flomax 0.4 mg daily; lisinopril 10 mg  daily; Bystolic 5 mg daily; simvastatin 20 mg daily; Protonix 40 mg  daily; glyburide/metformin 1.25/250 b.i.d.; fish oil 1200 mg 2 daily;  and multivitamin daily.   His past medical history is significant for coronary artery disease as  mentioned above.  He has a history of diabetes and hypertension.  He has  a history of gastroesophageal reflux disease.  He is status post PTCA  and stent placement in February 2003 prior to his coronary artery bypass  surgery and status post non-Q-wave myocardial infarction.   SOCIAL HISTORY:  He is unemployed.  He is a nonsmoker and denies alcohol  abuse.   FAMILY HISTORY:  Positive for lung cancer.  His brother died at age 39  of lung cancer, but was a heavy smoker.  His mother died at 29 with lung  cancer with brain metastasis.   PHYSICAL EXAMINATION:  VITAL SIGNS:  Blood pressure 153/76, his pulse is  76 and regular, his respiratory rate is 16, unlabored, and oxygen  saturation on room air is 98%.  GENERAL:  He is a well-developed white male in no distress.  HEENT:   Normocephalic and atraumatic.  Pupils are equal and reactive to  light and accommodation.  Extraocular muscles are intact.  Throat is  clear.  NECK:  Normal carotid pulses bilaterally.  There is no cervical or  supraclavicular adenopathy.  There is no JVD.  There is no thyromegaly.  CARDIAC:  Regular rate and rhythm with normal S1 and S2.  There is no  murmur, rub, or gallop.  There is a well-healed sternotomy incision.  LUNGS:  Clear.  ABDOMEN:  Active bowel sounds.  His abdomen is soft, mildly obese, and  nontender.  There are no palpable masses or organomegaly.  EXTREMITIES:  Bilateral lower leg chronic venous stasis changes with  brownish discoloration and brawny edema.  Pedal pulses are palpable  bilaterally.  SKIN:  Warm and dry.  NEUROLOGIC:  Alert and oriented x3.  Motor and sensory exams grossly  normal.   IMPRESSION:  The patient has a 2.5 cm left upper lobe lung mass  suspicious for possible bronchogenic carcinoma.  Given his history of  heart disease and previous bypass surgery, I would like to be as sure as  possible that this is cancer before proceeding with surgical treatment.  We will obtain a PET scan to further evaluate this lesion and rule out  metastatic disease.  We will also obtain pulmonary function test to  assess him for surgery.  He did have a CT scan of the head which was  negative, but he may need MRI to completely rule out brain metastasis.  I will plan to see him back in the office next week to review the  studies and decide on further course of therapy.   Evelene Croon, M.D.  Electronically Signed   BB/MEDQ  D:  09/05/2008  T:  09/06/2008  Job:  [id number]   cc:   Delaney Meigs, M.D.

## 2010-10-06 NOTE — Assessment & Plan Note (Signed)
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  Since I last saw him on  April 29, 2009, he has felt well.  He has had no cough or sputum  production.  He has had no hemoptysis.  He denies any chest pain or  shortness of breath.   PHYSICAL EXAMINATION:  Today, his blood pressure is 136/67, pulse 62 and  regular, and respiratory rate is 16, unlabored.  Oxygen saturation on  room air is 98%.  He looks well.  Cardiac exam shows regular rate and  rhythm with normal heart sounds.  His Lung exam is clear.  The left  thoracotomy incision is healing well.  There are no skin lesions.  There  is no cervical or supraclavicular adenopathy.   DIAGNOSTIC TESTS:  Followup chest x-ray shows stable scarring or  atelectasis at the left base.  There is no sign of any recurrence of  this lesion.   IMPRESSION:  The patient continues to do well following his surgery.  This lesion was completely resected and had no malignant features  identified.  I do not think it is necessary to continue close  surveillance following complete resection of this benign lesion.  He  will continue to follow up with his primary physician, Dr. Lysbeth Galas and I  told him he did not need to return to see me unless Dr. Lysbeth Galas feels  that he needs to do so.   Evelene Croon, M.D.  Electronically Signed   BB/MEDQ  D:  04/28/2010  T:  04/29/2010  Job:  [id number]   cc:   Delaney Meigs, M.D.

## 2010-10-09 NOTE — Discharge Summary (Signed)
[place]. [facility]  Patient:    [name], [name] Visit Number: [id number] MRN: [id number]          Service Type: CAT Location: [id number] Attending Physician:  Wesley Branch Dictated by:   Lavella Hammock, P.A. Admit Date:  07/10/2001 Disc. Date: 07/12/01   CC:         [name], ChavezO.             [name], M.D.                  Referring Physician Discharge [facility]  DATE OF BIRTH:  05/15/1946  PROCEDURES: 1. Cardiac catheterization. 2. Single-vessel arteriogram. 3. PTCA and stent for one vessel.  HOSPITAL COURSE:  [name] is a 65 year old male who was in the hospital from February 14 through July 08, 2001 for chest pain, coronary artery disease, and at that time he had stenting to the proximal and distal circumflex.  He has residual disease in the RCA and he was scheduled for readmission for cardiac catheterization and percutaneous intervention.  The patient was admitted for this on July 10, 2001.  On July 11, 2001 he had a cardiac catheterization and received PTCA and stent, reducing the stenosis in the RCA from 95% to less than 10% with TIMI 3 flow.  He tolerated the procedure well and the sheath was removed without difficulty.  The patient had eight beats of ventricular tachycardia at one point but was asymptomatic with this.  His Lopressor had been held because his medications were held precatheterization and this was restarted.  Later that night he had an episode of vomiting that was consistent with heartburn and a large amount of emesis was noted but it was not hematemesis.  The patient felt much better after vomiting and had no further problems.  The next day, the patient was ambulating without chest pain or difficulty.  His groin sites from both catheterizations were stable.  He was seen by cardiac rehab and received instructions in risk factors, precautions, diabetic diet, use of nitroglycerin, and  calling 911.  He was additionally given exercise guidelines.  The patient tolerated this well.  With no further chest pain and with groin stability with ambulation he was considered stable for discharge on July 12, 2001.  LABORATORY DATA:  Hemoglobin 13.1, hematocrit 37.7, wbcs 6.4, platelets 197. Sodium 136, potassium 4.0, chloride 101, CO2 27, BUN 9, creatinine 1.0, glucose 224.  Hemoglobin A1c 8.0.  Post procedure CK-MB 92/3.7.  TSH 1.010.  Chest x-ray:  Heart size within normal limits and the aorta is mildly tortuous but the lungs are clear.  DISCHARGE CONDITION:  Improved.  DISCHARGE DIAGNOSES:  1. Coronary artery disease, status post percutaneous transluminal coronary     angioplasty and stent to the right coronary artery this admission.  2. Status post percutaneous transluminal coronary angioplasty and stent to     the proximal circumflex and to the distal circumflex on July 07, 2001.  3. Residual disease in the left anterior descending artery at 50% and in the     second diagonal at 70%.  There is also 70% stenosis in the posterior     descending artery.  4. Preserved left ventricular function with no areas of hypokinesis by left     ventriculogram, July 07, 2001.  5. Non-insulin-dependent diabetes mellitus.  6. Diabetic retinopathy.  7. Hyperlipidemia.  8. History of alcohol abuse.  9. Family history of Alzheimers and  cancer but no coronary artery disease. 10. Hypertension. 11. Multiple laser eye surgeries.  DISCHARGE INSTRUCTIONS:  ACTIVITY:  His activity level is to include no driving, sexual, or strenuous activity for two days.  He is to stay out of work at least a week.  DIET:  He is to follow a low fat diabetic diet and says he will be more compliant with this.  SPECIAL INSTRUCTIONS:  He is not to use alcohol.  FOLLOW-UP:  He is to follow up with Dr. Dewaine Chavez to adjust his diabetic medications and for possible outpatient diabetic education.  He is  to follow up with Dr. Ashley Chavez as scheduled.  He has a follow-up appointment with the P.A. for Dr. Glennon Chavez on July 27, 2001 at 10:15 a.m.  DISCHARGE MEDICATIONS:  1. Glucovance 2.5/500 b.i.d. - restart Friday.  2. Lisinopril 10 mg q.d.  3. Multivitamin q.d.  4. Nitroglycerin 0.4 mg p.r.n.  5. Plavix 75 mg q.d.  6. Lopressor 50 mg one-half tablet b.i.d.  7. Lipitor 20 mg q.d. Dictated by:   Lavella Hammock, P.A. Attending Physician:  Wesley Branch DD:  07/12/01 TD:  07/12/01 Job: [id number]

## 2010-10-09 NOTE — Cardiovascular Report (Signed)
[place]. [facility]  Patient:    [name], [name] Visit Number: [id number] MRN: [id number]          Service Type: CAT Location: [id number] Attending Physician:  Colon Branch Dictated by:   Everardo Beals Juanda Chance, M.D. [facility] Proc. Date: 07/11/01 Admit Date:  07/10/2001   CC:         Colon Flattery, D.O.  Jonny Ruiz D. Ashley Royalty, M.D.  Cecil Cranker, M.D. [facility]   Cardiac Catheterization  CLINICAL HISTORY:  [name] is 65 years old and recently developed chest pain and was seen in Dr. Garnette Gunner office and was referred to Dr. Glennon Hamilton who admitted him with the diagnosis of unstable angina.  We intervened on his circumflex artery placing three stents in the proximal, circumflex, and marginal vessel.  We brought him back today after going home for a leave of absence for intervention on the distal right coronary artery.  PROCEDURE:  The procedure was performed via the left femoral artery using arterial sheath.  We first performed a diagnostic study of the left coronary artery to assess the results of the stent procedure on Friday which looked very good.  We then approached the right coronary artery.  The patient was given Angiomax which prolonged the ACT to greater than 300 seconds.  He had been on Plavix previously.  We used an AL-1, 7-French guiding catheter with sideholes and a Graphics PT intermediate support wire.  We crossed the lesion with the wire without too much difficulty.  We predilated with a 2.25 x 50 mm Quantum Maverick performing to the inflation of 10 atmospheres for 28 seconds. We then deployed a 2.25 x 12 mm Express stent following this with balloon inflation of 16 atmospheres for 52 seconds.  We then postdilated in the proximal portion of the stent avoiding the distal edge with a 2.75 x 8 mm Quantum Maverick with one inflation to 12 atmospheres for 36 seconds.  Repeat diagnostic studies were then performed with the guiding catheter.  The  patient tolerated the procedure well and left the laboratory in satisfactory condition.  ANGIOGRAPHIC RESULTS:  Initially, disease in the distal right coronary artery was estimated at 95 percent.  Following stenting, this improved to 0 percent.  There is no dissection seen.  CONCLUSIONS:  Successful stenting of the distal right coronary artery stenosis with improvement in percent narrowing from 95 percent to 0 percent.  DISPOSITION:  The patient is discharged for further observation. Dictated by:   Everardo Beals Juanda Chance, M.D. [facility] Attending Physician:  Colon Branch DD:  07/11/01 TD:  07/11/01 Job: [id number]

## 2010-10-09 NOTE — Cardiovascular Report (Signed)
[place]. [facility]  Patient:    [name], [name] Visit Number: [id number] MRN: [id number]          Service Type: MED Location: [phone] Attending Physician:  Ronaldo Miyamoto Dictated by:   Everardo Beals Juanda Chance, M.D. [facility] Proc. Date: 07/17/01 Admit Date:  07/14/2001   CC:         Arturo Morton. Riley Kill, M.D. [facility]  Colon Flattery, D.O.  Cardiopulmonary Lab   Cardiac Catheterization  CLINICAL HISTORY:  Mr. Bramhall is 65 years old and recently was admitted with chest pain and underwent staged PTCA of the circumflex artery and then the distal right coronary artery.  A day after discharge, he returned with recurrent chest pain and was restudied by Dr. Riley Kill and was found to have subtotal occlusion distal to the stent in the distal right coronary artery felt probably related to an edge tear which was not evident at the time of the initial procedure.  Dr. Riley Kill placed a new stent, which was 2.5 mm in diameter, overlying the old stent.  He had quite a bit of distal thrombus requiring repeat dilatations distally, and so he recommended the patient be brought back today for a relook.  PROCEDURE: 1. Cardiac catheterization. 2. Percutaneous coronary intervention on right coronary artery.  CARDIOLOGISTEverardo Beals Juanda Chance, M.D. [facility]  PROCEDURE IN DETAIL:  The procedure was performed via the right femoral artery using a arterial sheath and JR4 diagnostic catheter.  At the completion of diagnostic study and after review with Dr. Riley Kill, We made decision to proceed with intervention on the distal right coronary artery where there is a lesion distal to the second stent that Dr. Riley Kill put in three days ago.  The patient was given weight-adjusted heparin that prolonged the ACT to greater than 200 seconds and was given double bolus Integrilin and infusion. We used a 7-French hockey stick guiding catheter with side holes and a [employer] wire to assess  the length of stent that was needed.  We passed the wire down to the two previous stents, across the lesion, and into the first of two posterolateral branches with moderate difficulty.  We then advanced a 2.25 x 16 mm Express stent distal to the previous two stents and overlapping the second stent and positioned the distal edge right at the bifurcation of the first posterolateral branch and the A-V branch.  We deployed this with one inflation of 12 atmospheres for 42 seconds and then a second inflation with the balloon pulled inside the distal edge at 16 atmospheres for 41 seconds.  This gave a good result but pinched off the A-V portion of the bifurcation.  It did not appear that the stent overlapped the A-V bifurcation, but it was difficult to be sure.  We were able to advance the wire into the A-V portion to be exchanged for a Graphics PT wire to do this with some difficulty.  We then used a 2.0 x 15 mm Maverick and passed the balloon into the A-V branch across the distal edge of the stent and inflated it with three inflations up to 10 atmospheres for 41 seconds.  Repeat diagnostic study was then performed through the guiding catheter.  This improved but did not normalize the lesion right at the ostium of the A-V portion of the right coronary artery after the first posterolateral branch.  RESULTS:  The right coronary artery was a very tortuous vessel with a shepherds crook.  There  was a 50 to 60% lesion in the mid point with some aneurysmal dilatation.  The two previous stents were widely patent.  Distal to the stent, there was a filling defect that appeared to either be a dissection or a thrombus (we thought probably a dissection) extending to the bifurcation of the first posterolateral branch and the A-V portion of the right, the second posterolateral branch.  There was also a 70 to 80% stenosis at the ostium of the posterior descending branch which was present previously.  Following  stenting of this lesion, the stenosis improved from 90% to less than 10%.  The ostial portion of the A-V branch just after the first posterolateral branch was left with residual narrowing of 50 to 70% following dilatation.  CONCLUSION: 1. Coronary artery disease status post multiple percutaneous interventions as    described above with a new 90% stenosis in the distal right coronary artery    distal to the two previous stents probably related to distal dissection. 2. Successful stenting of the lesion in the distal right coronary artery with    improvement in stent narrowing from 90% to less than 10% with residual    disease in the atrioventricular portion of the second posterolateral branch    of 50 to 70%.  DISPOSITION:  The patient was returned to holding area for further observation.  I am not too concerned about the distal posterolateral branch. It is a fairly small vessel and has good flow, not severe compromise of the ostial portion of the lumen at present.  I am more concerned about the patients long-term risk for restenosis with three overlapping stents which are 2.25 to 2.5 in diameter.  We will try to return the patient to the post angioplasty unit for further observation. Dictated by:   Everardo Beals Juanda Chance, M.D. [facility] Attending Physician:  Ronaldo Miyamoto DD:  07/17/01 TD:  07/17/01 Job: [id number]

## 2010-10-09 NOTE — Discharge Summary (Signed)
[place]. [facility]  Patient:    [name], [name] Visit Number: [id number] MRN: [id number]          Service Type: CAT Location: [facility] 2899 [id number] Attending Physician:  Colon Branch Admit Date:  07/07/2001   CC:         Colon Flattery, D.O.  Cecil Cranker, M.D. [facility]  John D. Ashley Royalty, M.D.   Discharge Summary  DATE OF BIRTH:  01-06-1946  SUMMARY OF HISTORY:  [name] is a 65 year old white male who is referred to our office by Dr. Dewaine Conger in regards to chest discomfort.  [name] describes at least an eight month history of exertional chest discomfort which he describes as tightness.  This tightness does not radiate into shoulders, back, neck, arms, or abdomen nor is it associated with nausea, vomiting, shortness of breath, or diaphoresis.  The discomfort comes on with exertion.  It is relieved promptly with rest.  [name] states that it probably lasts less than a minute or two.  He has not had any nocturnal or rest episodes.  The discomfort may occur every other day, once a week.  He states that it is hard to predict.  He has not had to use any nitroglycerin that was recently given to him by Dr. Dewaine Conger.  He did state that he took a baby aspirin this morning. When he does have the above tightness he states that sometimes he feels like his heart beat is beating "hard."  He denies any problems with orthopnea, PND, syncope.  He does describe occasional pedal edema which is resolved at night. The discomfort noted above has occurred with using the stairs, mowing the lawn, or at work.  ALLERGIES:  No known drug allergies.  CURRENT MEDICATIONS: 1. Glucovance 2.5/500 mg b.i.d. 2. Lisinopril 10 mg p.o. q.d. 3. Multivitamin q.d.  PAST MEDICAL HISTORY:  Notable for non-insulin-dependent diabetes for unknown duration, diabetic retinopathy and sees Dr. Alan Mulder, has had multiple laser eye surgeries and cataract problems, history of  hypertension, has been treated for kidney stones in the past, has had a mole removed from his forehead and leg.  He denies any history of COPD, CVA, myocardial infarction, bleeding dyscrasias, or thyroid dysfunction.  SOCIAL HISTORY:  He resides with his wife since 94.  They have one son age 44 who is alive and well.  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  Job involves many different aspects.  He enjoys Research scientist (life sciences). He does not exercise or follow his diet regularly.  He has noted an increased amount of stress secondary to try to settle his mothers estate over the last four years.  FAMILY HISTORY:  Notable for the death of his father at the age of 34 with pneumonia, history of Alzheimers.  His mother at the age of 26 with lung cancer.  He has one brother deceased at age 57 from lung cancer.  He does not know any of his health concerns.  His sister in her 64s, her health is unknown.  PHYSICAL EXAMINATION  GENERAL:  Well-developed, well-nourished, pleasant white male in no apparent distress.  VITAL SIGNS:  Blood pressure 178/80, pulse 80 and regular, weight 237 pounds.  GENERAL:  He has multiple tattoos over the bilateral upper extremities and his left shoulder blade.  HEENT:  Unremarkable.  NECK:  Supple without thyromegaly, adenopathy, JVD, masses, or carotid bruits.  CHEST:  Symmetrical excursion.  LUNGS:  Clear to auscultation.  HEART:  No lifts, heaves, or thrills.  PMI is not displaced.  Regular rate and rhythm.  No murmurs, rubs, clicks, or gallops.  ABDOMEN:  Obese.  Bowel sounds present.  No abdominal bruits.  No organomegaly, masses, or tenderness.  EXTREMITIES:  Negative cyanosis, clubbing.  He does have trace edema in the lower extremities.  It is accompanied by venous stasis changes.  Pedal pulses were symmetrical and intact.  IMPRESSION:  After Dr. Corinda Gubler reviewed and discussed with [name] it is felt that he has acute coronary  syndrome until proven otherwise, especially given his risk factors of diabetes and hypertension and hyperlipidemia (Last check June 27, 2001:  Total cholesterol 178, triglycerides 92, HDL 43, LDL 117), obesity.  It is felt he should undergo cardiac catheterization to evaluate his coronary artery anatomy.  Procedure risks and benefits have been explained to [name] and his wife and they agree to proceed as planned and this is scheduled for the morning of July 07, 2001.  In addition we will start him on a baby aspirin 81 mg q.d.  This was discussed with Dr. Ashley Royalty. We will also begin him on Lopressor 25 mg p.o. b.i.d. and a Statin will be started while he is in the hospital.  He will need extensive cardiac risk factor modification regardless of what his cardiac catheterization shows. Attending Physician:  Colon Branch DD:  07/06/01 TD:  07/06/01 Job: [id number]

## 2010-10-09 NOTE — Cardiovascular Report (Signed)
[place]. [facility]  Patient:    [name], ROUTE Visit Number: [id number] MRN: [id number]          Service Type: MED Location: [facility] 2927 01 Attending Physician:  Ronaldo Miyamoto Dictated by:   Arturo Morton Riley Kill, M.D. [facility] Proc. Date: 07/14/01 Admit Date:  07/14/2001   CC:         Bruce R. Juanda Chance, M.D. [facility]  Cardiovascular Laboratory   Cardiac Catheterization  INDICATIONS: The patient is a 65 year old gentleman, who had a complicated circumflex intervention, followed by a difficult right coronary intervention. Since discharge from the hospital, he has continued to have chest discomfort, and as a result, came back to the emergency room for further evaluation. He was seen in the emergency room and brought back emergently to the cardiac catheterization.  PROCEDURES: 1. Emergency heart catheterization. 2. Selective coronary arteriography. 3. Emergency percutaneous coronary intervention of the right coronary artery.  DESCRIPTION OF PROCEDURE: The patient was brought to the catheterization lab and prepped and draped in the usual fashion. Through an anterior puncture the left femoral artery was easily entered. A 6 French sheath was placed. Views of the left and right coronary arteries were obtained in multiple angiographic projections. What was evident was that the patient had a thrombus just distal to the previously placed stent with subtotal occlusion of the posterolateral system. Preparations were then made for emergency intervention. I spoke with his wife. Using a hockey-stick guide, and a 7 [place] sheath, the lesion was crossed using a Graphix PT wire. We initially used a traverse wire but could not get it beyond the distal vessel. With the Graphix PT we were able to cross but there was decreased flow in the distal vessel. Ultimately, dilatations were performed at this site and the wire pulled back. There was evidence of distal embolization of  thrombus, and we were able to manipulate the wire distally and pass a balloon down to this are with successful thrombolysis with the balloon alone without inflation. The second branch also had evidence of thrombus and this was also thrombolized using a wire and a balloon. The area just distal to the previously placed stent had evidence of edge tear, and we then placed an 18 mm x 2.5 Guidant Pixel stent. This was taken up to 9 atmospheres, the balloon pulled back slightly, and then taken up to about 12 atmospheres at this location. This improved this area, but there continued to be evidence of some thrombus right at the bifurcation site and this area was eventually crossed again with a wire and several inflations performed with the balloon with gradual improvement in the appearance of the artery.  With this, there was TIMI-3 flow and relief of pain and we elected to stop the procedure at this point in time.  All catheters were subsequently removed, the femoral sheath sewn in place, and the patient taken to the holding area in satisfactory clinical condition.  ANGIOGRAPHIC DATA: The left main coronary artery was free of critical disease.  The left anterior descending artery coursed to the apex and there was segmental 50% narrowing in the mid vessel. The diagonal had about 70% mid narrowing as well.  The ramus intermedius has about 75% proximal narrowing. It is a moderate sized vessel distally.  The native circumflex along a long area extending into the large bifurcating marginal branch had three overlapping stents which were widely patent with vigorous flow. There was 50-70% narrowing of the ostium of an AV branch  that came out in the middle of the stents.  The right coronary artery demonstrates about 30% mid narrowing then 50% and 40% lesions distally. The stent site is patent leading into the PDA. The PDA has about 70-80% narrowing proximally. Just beyond the stent, which  extends into the continuation branch, there was evidence of subtotal occlusion with thrombus and poor distal flow. This area was redilated with distal embolization but this eventually improved after several low level inflations distally. The site of edge tear was stented with an 18 mm x 2.5 stent, and this opened this vessel up with excellent TIMI-3 flow.  CONCLUSIONS: Successful percutaneous repeat dilatation of the distal right coronary artery at the site of edge tear from previous stenting with distal thrombolysis using mechanical means.  DISPOSITION: We will treat the patient medically. I will suggest a restudy later beginning next week. Dictated by:   Arturo Morton Riley Kill, M.D. [facility] Attending Physician:  Ronaldo Miyamoto DD:  07/14/01 TD:  07/15/01 Job: [id number]

## 2010-10-09 NOTE — Consult Note (Signed)
NAME:  [name], [name]                           ACCOUNT NO.:  [id number]   MEDICAL RECORD NO.:  [id number]                   PATIENT TYPE:  EMS   LOCATION:  MINO                                 FACILITY:  [facility]   PHYSICIAN:  Rollene Rotunda, M.D. [facility]            DATE OF BIRTH:  01-18-1946   DATE OF CONSULTATION:  04/20/2002  DATE OF DISCHARGE:                                   CONSULTATION   PRIMARY CARE PHYSICIAN:  Pearletha Alfred, M.D.   CARDIOLOGIST:  Charlies Constable, M.D.   REASON FOR CONSULTATION:  Emergency room visit to evaluate the patient with  tachycardia.   HISTORY OF PRESENT ILLNESS:  The patient is a 65 year old gentleman  discharged yesterday.  He has a history of coronary artery bypass grafting  in early October.  He has been admitted with volume overload.  The last  admission, 04/14/02, he was noted to have a large pericardial effusion and  pleural effusion.  Both of these were tapped.  He had much improvement in  his symptomatic dyspnea.  He has been doing well at home.  However, he  started noticing an increased heart rate.  He felt his heart pounding in his  chest.  He was not having any syncope or presyncope.  He did not have chest  discomfort, neck discomfort, or arm discomfort.  He was having no fevers or  chills.  He had no cough.  He had been eating well and having normal bowel  movements.  Of note, he was sent home on Lopressor.  However, he was unclear  that Lopressor and metoprolol were the same drugs.  The bottle said  metoprolol.  He called up to the fourth floor apparently and was told not to  take this.  Therefore, he has missed three doses of his beta blocker.   PAST MEDICAL HISTORY:  1. Coronary artery disease, status post coronary artery bypass grafting x5     on 02/23/02, with a left internal mammary artery to the left anterior     descending, saphenous vein graft to ramus, saphenous vein graft to obtuse     marginal, saphenous vein graft to posterior  descending artery, saphenous     vein graft to posterior lateral.  2. Diabetes mellitus.  3. Microcytic anemia.  4. Bronchitis.  5. History of ETOH use.  6. Hypertension.  7. Gastroesophageal reflux disease.  8. Hyperlipidemia.  9. Previous percutaneous revascularization.  10.      Renal calculi.  11.      Diabetic retinopathy.  12.      Previous marijuana use.   ALLERGIES:  No known drug allergies.   MEDICATIONS:  1. Protonix 40 mg q.d.  2. Prinivil 10 mg q.d., 5 mg q.p.m.  3. Aspirin 325 mg q.d.  4. Colace.  5. Multivitamin.  6. Iron 325 mg q.d.  7. Lopressor 50 mg b.i.d.  8. Zocor 20  mg q.h.s.  9. Lasix 80 mg b.i.d.  10.      Tequin 400 mg q.d.  11.      Glucotrol 5 mg q.d.   SOCIAL HISTORY:  The patient lives in [place] with his wife.  He used to  work in [employer].  He is currently not smoking cigarettes, and  rarely drinking alcohol.   FAMILY HISTORY:  Negative for early coronary artery disease.   REVIEW OF SYMPTOMS:  As stated in the HPI, and otherwise negative for other  systems.   PHYSICAL EXAMINATION:  GENERAL:  The patient is in no distress.  VITAL SIGNS:  Blood pressure 154/73, heart rate 114 and sinus, no  orthostatic blood pressure changes, 98% saturation on room air.  HEENT:  Eyes are unremarkable.  Pupils equal, round, reactive to light.  Fundi not visualized.  NECK:  No jugular venous distention, wave form within normal limits, carotid  upstrokes brisk and symmetrical.  There are no bruits or thyromegaly.  LYMPHATICS:  Negative.  LUNGS:  Decreased breath sounds on the left, no wheezing, no crackles.  BACK:  No costovertebral angle tenderness.  CHEST:  Well-healed midline sternotomy scar.  HEART:  PMI not displaced or sustained, S1 and S2 within normal limits, no  S3, no S4, no murmurs.  ABDOMEN:  Flat, positive bowel sounds, normal in frequency and pitch.  No  bruits, no rebound, no guarding, no midline pulsatile mass, no  organomegaly.  SKIN:  No rash.  EXTREMITIES:  Pulses are 2+ throughout.  Moderate lower extremity edema  bilaterally to mid calf, well-healed saphenous vein graft harvest sites.  NEUROLOGIC:  Grossly intact.   LABORATORY DATA:  Glucose 202, BUN 21, sodium 134, potassium 3.9, hematocrit  39.   Chest x-ray showed moderate left-sided pleural effusion unchanged from  04/16/02, decreased vascular congestion, decreased cardiomegaly.   Chest x-ray shows sinus tachycardia, axis within normal limits, intervals  within normal limits, nonspecific ST-T wave changes.   ASSESSMENT AND PLAN:  1. Tachycardia.  The patient's tachycardia can be explained by the fact that     he has not had three doses of his beta blocker.  He has absolutely no     other symptoms of ongoing systemic reasons for his tachycardia.  He     otherwise feels very well.  He will be re-started on the beta blocker,     and will let us know how his heart rate responds.  He     is to get a follow up appointment with our office calling him for an     appointment with Dr. Juanda Chance.  2. Volume overload.  The patient will continue on the medications as listed.  3. Follow up as above.                                               Rollene Rotunda, M.D. [facility]    JH/MEDQ  D:  04/20/2002  T:  04/20/2002  Job:  [id number]

## 2010-10-09 NOTE — Cardiovascular Report (Addendum)
NAME:  [name], [name]                           ACCOUNT NO.:  [id number]   MEDICAL RECORD NO.:  [id number]                   PATIENT TYPE:  OIB   LOCATION:  2899                                 FACILITY:  [facility]   PHYSICIAN:  Learta Codding, M.D. [facility]             DATE OF BIRTH:  04/19/1946   DATE OF PROCEDURE:  02/09/2002  DATE OF DISCHARGE:                              CARDIAC CATHETERIZATION   CARDIOLOGIST:  Everardo Beals. Juanda Chance, M.D.   PROCEDURES PERFORMED:  1. Left heart catheterization with selective right coronary angiography.  2. Ventriculography.   DIAGNOSES:  1. High-grade in-stent re-stenosis of the proximal circumflex coronary     artery.  2. In-stent re-stenosis of the distal right coronary artery.  3. High-grade stenosis of a third posterolateral branch as well as a small     ramus branch.   INDICATION:  The patient is a 65 year old diabetic male with severe coronary  artery disease. The patient was admitted with myocardial infarction in  February 2003.  Dr. Juanda Chance proceeded at that time with a staged procedure  with stent placement to the proximal circumflex coronary artery (x2) and a  distal circumflex coronary artery.  He received ______and also a stent to                                         the right coronary artery several days later. Several days after discharge,  however, the patient developed recurrent pain, was brought back to the lab  by Dr. Riley Kill and was found to have a distal edge tear of the distal RCA  stent. An additional stent was placed, and prior to hopeful discharge, Dr.  Juanda Chance brought the patient back to the lab due to recurrent chest pain  requiring placement of a third stent to the distal right coronary artery.  The patient had been doing well since February. He denied substernal chest  pain, although he is rather sedentary. He was seen in the office recently,  underwent Cardiolite scan which showed ischemia to the inferior wall. The  patient  was scheduled for a cardiac catheterization to reassess his coronary  anatomy and evaluate him for in-stent re-stenosis.   DESCRIPTION OF PROCEDURE:  After informed consent was obtained, the patient  was brought to the catheterization laboratory.  The right groin was  sterilely prepped and draped.  A 6 French arterial sheath was placed using  the modified Seldinger technique.  Subsequently, a JL4 and JR4 catheters  were used to engage the left and right coronary ostia respectively.  Selective coronary angiography was performed in various projections using  manual injection of contrast. Ventriculography was then performed using a 6  French angled pigtail catheter. At the termination of the procedure, all  catheters and sheath were removed and the patient was brought  back to the  holding area. Adequate hemostasis was obtained.   FINDINGS:  HEMODYNAMICS: Left ventricular pressure 151/20 mmHg, aortic pressure 151/71  mmHg.  There was no gradient on aortic pullback.   VENTRICULOGRAPHY:  Ejection fraction 55%, single plane RAO projection. There  was an area of inferobasal hypokinesis. There was no mitral regurgitation  noted.   SELECTIVE CORONARY ANGIOGRAPHY:  1. Left main coronary artery is a large caliber vessel with no evidence of     flow-limiting coronary artery disease.  2. Left anterior descending artery is a large vessel with diffuse plaquing     in the mid segment and diffuse 50% stenosis in the distal vessel.     Otherwise, the left anterior descending demonstrates no evidence of flow-     limiting coronary artery disease as well as the diagonal branches which     were free of flow-limiting disease.  3. Ramus: There is a small ramus branch which has a high-grade ostial     stenosis. This vessel appears to be less than 2 mm.  4. Circumflex coronary artery is a large caliber vessel.  There is a 90% in-     stent re-stenosis of the proximal circumflex coronary artery. The side      branch appears to be jailed.  The distal circumflex coronary artery has a     patent stent.  5. The right coronary artery is a large caliber vessel, diffusely diseased     in the mid segment approximately 40% and more distal disease , which is     rather diffuse, of 60-70%. There is a 90% high-grade in-stent re-stenosis     just prior to the takeoff of the posterior descending artery. The second     and third stent appear to have diffuse in-stent re-stenosis of     approximately 60-70% at the distal edge of the third stent into the third     posterolateral branch, there is also high-grade 90% stenosis. The     posterior descending artery is poorly visualized but appears to have     collateral flow from left to right.  The posterolateral branches are also     filling from left to right collaterals.   RECOMMENDATIONS:  Angiographic images were reviewed with Dr. Samule Ohm.  Given  the fact that the patient is a diabetic and has in-stent re-stenosis, the  plan would be to proceed with brachytherapy with a same day procedure to the  circumflex coronary artery and brachytherapy to the distal right coronary  artery. The patient has been placed on the schedule for Dr. Juanda Chance for  Tuesday. In the interim, I have also recommended to the patient to hold  Glucovance x48 hours and to start taking Lipitor at 80 mg p.o. q.h.s. as  well as the addition of fish oil 1 g p.o. b.i.d. given his very severe and  aggressive coronary artery disease. I have also spent some time with the  patient and his mother talking about dietary restrictions and given some  recommendations regarding the Mediterranean diet.                                                 Learta Codding, M.D. [facility]    GED/MEDQ  D:  02/09/2002  T:  02/12/2002  Job:  [id number]   cc:   Smitty Cords  R. Juanda Chance, M.D. [facility]  [address]  [place]  [place] [address]

## 2010-10-09 NOTE — Op Note (Signed)
NAME:  [name], [name]                           ACCOUNT NO.:  [id number]   MEDICAL RECORD NO.:  [id number]                   PATIENT TYPE:  INP   LOCATION:  2010                                 FACILITY:  [facility]   PHYSICIAN:  Evelene Croon, MD                    DATE OF BIRTH:  08-Aug-1945   DATE OF PROCEDURE:  DATE OF DISCHARGE:                                 OPERATIVE REPORT   PREOPERATIVE DIAGNOSIS:  Severe three-vessel coronary artery disease.   POSTOPERATIVE DIAGNOSIS:  Severe three-vessel coronary artery disease.   PROCEDURES:  1. Median sternotomy.  2. Extracorporeal circulation.  3. Coronary artery bypass graft surgery x5 using a left internal mammary     artery graft to the left anterior descending coronary artery, with a     sequential saphenous vein graft to the intermediate and obtuse marginal     branches of the left circumflex coronary artery, and a sequential     saphenous vein graft to the posterior descending and posterolateral     branches of the right coronary artery.  4. Endoscopic vein harvesting from the right thigh.   SURGEON:  Evelene Croon, M.D.   ASSISTANTS:  Gwenith Daily. Tyrone Sage, M.D., and Debby Freiberg. Thomasena Edis, P.A.   ANESTHESIA:  General endotracheal.   CLINICAL HISTORY:  This patient is a 65 year old gentleman with a history of  coronary artery disease, status post stenting of his left circumflex and  distal right coronary arteries in February 2003.  He now presents with  recurrent angina and a stress Cardiolite scan showing evidence of moderate  to severe inferior peri-infarct ischemia with an ejection fraction of 54%.  Cardiac catheterization showed the LAD to be a large vessel that was  diffusely irregular in its midsegment with about 50% stenosis.  There was a  small intermediate vessel with a high-grade ostial stenosis.  The left  circumflex had 90% in-stent restenosis in a proximal area.  The right  coronary artery was a large vessel.  There was  diffuse disease from the  midportion.  There was 60-70% distal disease.  There was 90% in-stent  restenosis just prior to the takeoff of the posterior descending branch, and  then the second and third stents had diffuse in-stent restenosis of about 60-  70%.  There was also about 90% stenosis at the distal edge of the third  stent extending into the third posterolateral branch.  The left ventricular  ejection fraction was about 55% with inferobasal hypokinesis.  There was no  mitral regurgitation.  After review of the angiogram and examination of the  patient, it was felt that coronary artery bypass graft surgery was the best  treatment.  I discussed the operative procedure with the patient and his  wife, including alternatives, benefits, and risks, including bleeding, blood  transfusion, infection, stroke, myocardial infarction, and death.  They  understood and agreed  to proceed.   DESCRIPTION OF PROCEDURE:  The patient was taken to the operating room and  placed on the table in a supine position.  After induction of general  endotracheal anesthesia, a Foley catheter was placed in the bladder using  sterile technique.  Then the chest, abdomen, and both lower extremities were  prepped and draped in the usual sterile manner.  His chest was entered  through a median sternotomy incision and the pericardium opened in the  midline.  Examination of the heart showed good ventricular contractility.  The ascending aorta had no palpable plaques in it.   Then the left internal mammary artery was harvested from the chest wall as a  pedicle graft.  This was a medium-caliber vessel with excellent blood flow  through it.  At the same time a segment of greater saphenous vein was  harvested from the right thigh using endoscopic vein harvest technique.  Unfortunately, this vein was of fairly small caliber and would not dilate at  all and was felt to be unsuitable.  Therefore, the left thigh greater   saphenous vein was harvested.  We attempted to harvest this vein using  endoscopic technique but had to abort that and open the thigh to harvest  this vein due to oozing which was making visualization very difficult  through the scope.  This left leg saphenous vein was of medium caliber and  good quality.   Then the patient was heparinized and when an adequate activated clotting  time was achieved, the distal ascending aorta was cannulated using a 20  French aortic cannula for arterial inflow.  Venous outflow was achieved  using a two-stage venous cannula through the right atrial appendage.  An  antegrade cardioplegia and vent cannula was inserted in the aortic root.   The patient was placed on cardiopulmonary bypass and the distal coronary  arteries identified.  The LAD was a large, graftable vessel.  The  intermediate was small but graftable.  The obtuse marginal was a large  vessel that was heavily diseased proximally and was intramyocardial.  It was  located distally and was a good vessel for grafting.  The right coronary  artery was diffusely diseased.  The posterior descending branch was small  but graftable.  The posterolateral branch was a bifurcating vessel which was  smaller than the posterior descending aorta but just felt to be graftable.   Then the aorta was crossclamped and 500 cc of cold blood antegrade  cardioplegia was administered in the aortic root with quick arrest of the  heart.  Systemic hypothermia to 20 degrees Centigrade and topical  hypothermia with iced saline was used.  A temperature probe was placed in  the septum and an insulating pad in the pericardium.   The first distal anastomosis was performed to the intermediate coronary  artery.  The internal diameter was 1.5 mm.  The conduit used was a segment  of greater saphenous vein and the anastomosis performed in a sequential side- to-side manner using continuous 7-0 Prolene suture.  Flow was measured   through the graft and was excellent.   The second distal anastomosis was performed to the obtuse marginal branch.  The internal diameter of this vessel was about 2 mm.  The conduit used was  the same segment of greater saphenous vein and the anastomosis performed in  a sequential end-to-side manner using continuous 7-0 Prolene suture.  Flow  was measured through the graft and was excellent.  Then a dose  of  cardioplegia was given down the vein graft and in the aortic root.   The third distal anastomosis was performed to the posterior descending  coronary artery.  The internal diameter was 1.6 mm.  The conduit used was a  second segment of greater saphenous vein and the anastomosis performed in a  sequential side-to-side manner using continuous 7-0 Prolene suture.  Flow  was measured through the graft and was excellent.   The fourth distal anastomosis was performed to the posterolateral branch.  The internal diameter was about 1.4 mm.  The conduit used was the same  segment of greater saphenous vein and the anastomosis performed in a  sequential end-to-side manner using continuous 8-0 Prolene suture.  Flow was  measured through the graft and was excellent.  Then a dose of cardioplegia  was given down the vein graft and in the aortic root.   The fifth distal anastomosis was performed to the midportion of the left  anterior descending coronary artery.  The internal diameter of this vessel  was about 2.5 mm.  The conduit used was the left internal mammary artery  graft, and this was brought through an opening in the left pericardium  anterior to the phrenic nerve.  It was anastomosed to the LAD in an end-to-  side manner using continuous 8-0 Prolene suture.  The pedicle was tacked to  the epicardium with 6-0 Prolene sutures.  The patient was rewarmed to 37  degrees Centigrade and the clamp removed from the mammary pedicle.  There  was rapid rewarming of the ventricular septum and the return  of spontaneous  ventricular fibrillation.  The crossclamp was removed with a time of 68  minutes and the patient defibrillated into sinus rhythm.   A partial occlusion clamp was placed on the aortic root and the two proximal  vein graft anastomoses were performed in an end-to-side manner using  continuous 6-0 Prolene suture.  The clamp was removed, the vein grafts de-  aired, and the clamps removed from them.  The proximal and distal  anastomoses appeared hemostatic and the alignment of the grafts  satisfactory.  Graft markers were placed around the proximal anastomoses.  Two temporary right ventricular and right atrial pacing wires were placed  and brought out through the skin.   When the patient had rewarmed to 37 degrees Centigrade, he was weaned from  cardiopulmonary bypass on no inotropic agents.  Total bypass time was 105  minutes.  Cardiac function appeared good with a cardiac output of 4 L/min. Protamine was given, and the venous and aortic cannulas were removed without  difficulty.  Hemostasis was achieved.  The patient was given 10 units of  platelets due to thrombocytopenia with a platelet count of 90,000, and he  had been on Plavix preoperatively.  Three chest tubes were placed with two  in the posterior pericardium, one in the left pleural space, and one in the  anterior mediastinum.  The pericardium was loosely reapproximated over the  heart.  The sternum was closed with #6 stainless steel wire.  The fascia was  closed using continuous #1 Vicryl suture.  Subcutaneous tissue was closed  with continuous 2-0 Vicryl and the skin with 3-0 Vicryl subcuticular  closure.  The lower extremity vein harvest site was closed in layers in a  similar manner.  The sponge, needle, and instrument counts were correct  according to the scrub nurse.  Dry sterile dressings were applied over the  incisions and around the  chest tubes, which were hooked to Pleuravac  suction.  The patient remained  hemodynamically stable and was transported to  the SICU in guarded but stable condition.                                               Evelene Croon, MD    BB/MEDQ  D:  02/23/2002  T:  02/26/2002  Job:  [id number]   cc:   Everardo Beals. Juanda Chance, M.D. [facility]  [address]  [place]  [place] [address]   Cardiac Catheterization Laboratory

## 2010-10-09 NOTE — Discharge Summary (Signed)
   NAME:  [name], [name] NO.:  [id number]   MEDICAL RECORD NO.:  [id number]                   PATIENT TYPE:  INP   LOCATION:  3105                                 FACILITY:  [facility]   PHYSICIAN:  Deirdre Peer. Polite, M.D.              DATE OF BIRTH:  [birth year]   DATE OF ADMISSION:  04/13/2002  DATE OF DISCHARGE:  04/14/2002                           DISCHARGE SUMMARY - REFERRING   DISCHARGE DIAGNOSES:  1. Volume overload secondary to excess sodium intake.  2. Coronary artery disease status post coronary artery bypass graft February 23, 2002.  3. Hyperlipidemia.  4. Noninsulin-dependent diabetes mellitus.  5. Hypertension.  6. Gastroesophageal reflux disease.   BRIEF HISTORY AND HOSPITAL COURSE:  The patient is a 65 year old male who  was discharged on February 28, 2002 on postop day #5 after a five-vessel CABG  which had an uncomplicated postoperative course.  Over the past several days  he has noted increased lower extremity edema and shortness of breath.  He  was admitted on February 13, 2002 with volume overload possibly related to  his increased sodium intake.  He was aggressively diuresed and over a 48-  hour period he was felt to be ready for discharge to home.  The patient was  discharged home in stable condition and asked to follow his weights at home.  He was discharged to home on the following medications.   DISCHARGE MEDICATIONS:  Lasix 40 mg one tablet twice a day, potassium 20 mEq  twice a day, he is to continue his aspirin, Lanoxin 0.125 mg a day, Niferex,  Glucovance, Colace, Protonix, Lipitor, and lisinopril 10 mg a day.   DIET:  No added salt.   ACTIVITY:  As prior to admission.   FOLLOW UP:  He will need to be followed up in the office in 1 week.    LABORATORY STUDIES DURING HOSPITALIZATION:  Hemoglobin of 9.7, hematocrit of  29.6, platelets 261.  Sodium 134, potassium 3.6, BUN 21, creatinine 1.3.  Cardiac isoenzymes were  negative.     Guy Franco, P.A. [facility]                      Ronald D. Polite, M.D.    Arlana Hove  D:  04/14/2002  T:  04/14/2002  Job:  [id number]   cc:   Charlies Constable, M.D. [facility]  [address]  [place]  [place] [address]   Colon Flattery, D.O.

## 2010-10-09 NOTE — H&P (Signed)
[place]  Patient:    [name], [name] Visit Number: [id number] MRN: [id number]          Service Type: CAT Location: 6500 [id number] Attending Physician:  Colon Branch Dictated by:   Guy Franco, P.A. Admit Date:  07/10/2001   CC:         Bruce R. Juanda Chance, M.D. [facility]   History and Physical  CHIEF COMPLAINT:  Chest pain.  HISTORY OF PRESENT ILLNESS:  Mr. Godek is a 65 year old male patient with known coronary artery disease who underwent stent placement x3 to the circumflex artery on July 07, 2001.  This was a staged procedure and he was readmitted on July 10, 2001 for a stent placement to the right coronary artery.  He now presents with recurrent substernal chest pain.  Over the past several days, he had had some fleeting episodes of chest pain, however, this chest pain awoke him around 4 a.m.  He described this as heaviness over his left anterior chest with associated diaphoresis; no nausea, vomiting, palpitations or syncope.  He took sublingual nitroglycerin x2 and this helped resolve his pain, but he went ahead and came to the emergency room for evaluation.  Currently, he is not pain-free.  ALLERGIES:  No known drug allergies.  MEDICATIONS: 1. Glucovance 2.5/500 mg one p.o. b.i.d.  He has not restarted this since his    last procedure. 2. Lisinopril 10 mg one p.o. q.d. 3. Multivitamin daily. 4. Sublingual nitroglycerin p.r.n. 5. Plavix 75 mg one p.o. q.d. 6. Lopressor 50 mg one-half tablet p.o. b.i.d. 7. Lipitor 20 mg one p.o. q.d.  PAST MEDICAL HISTORY:  NIDDM; diabetic retinopathy, followed by Dr. Beulah Gandy. Ashley Royalty; history of hypertension and nephrolithiasis.  His last catheterization on July 07, 2001 revealed EF of 60%, LAD -- 50% mid, 70% diagonal; circumflex -- 90% proximal followed by another 40% proximal lesion and a 90% distal lesion; right coronary artery -- 50% distal, 90% just past the 50% distal lesion,  and then a 70% PDA.  He underwent stent placement to his circumflex x3 on July 07, 2001 and then stent to the right coronary artery on July 10, 2001 by Dr. Everardo Beals. Brodie.  SOCIAL HISTORY:  He lives in [place] with his wife, works at [employer], has one child, denies any tobacco, alcohol or illicit drug use, follows a diabetic diet.  FAMILY HISTORY:  Mom with a history of lung cancer.  Brother died secondary to lung cancer.  Dad died at age 38, pneumonia, Alzheimers.  Sister with unknown health problems.  REVIEW OF SYSTEMS:  As above, otherwise, all other systems negative.  PHYSICAL EXAMINATION:  VITAL SIGNS:  Temperature 98.6, pulse 84, respirations 20, blood pressure 164/91, O2 saturations 100% on 2 L.  GENERAL:  In no acute distress.  HEENT:  Grossly normal.  NECK:  No carotid or subclavian bruits.  CHEST:  Clear to auscultation bilaterally.  No wheezes or rhonchi.  HEART:  Regular rate and rhythm.  No gross murmur, rub or ectopy.  ABDOMEN:  Good bowel sounds.  Nontender, nondistended.  No masses.  EXTREMITIES:  No cyanosis or clubbing.  No lower extremity edema.  Palpable PT/DP pulses bilaterally.  Bilateral groin ecchymoses with no bruits from his catheterizations; apparently, Dr. Juanda Chance accessed both groins, since these procedures were so close together.  NEUROLOGIC:  Cranial nerves II-XII are grossly intact.  LABORATORY AND ACCESSORY DATA:  Chest x-ray:  No acute disease.  EKG:  Normal sinus rhythm, rate 89, no acute ST-T wave changes.  Labs:  Hemoglobin 13.4, hematocrit 39, white count 7.2, platelets 200,000. Sodium 133, potassium 4.3, BUN 17, creatinine 1.1, glucose 253.  CK 95, MB fraction 1.4.  Troponin was elevated at 0.34.  INR was 1.0.  PT 13.4 and PTT 29.  ASSESSMENT AND PLAN: 1. Unstable angina, now with elevated troponin level consistent with acute    myocardial infarction. 2. Known coronary artery disease. 3.  Diabetes mellitus, oral agent. 4. Hyperlipidemia. 5. Hypertension.  The patient was seen and examined by Dr. Arturo Morton. [place].  He was started on intravenous heparin, intravenous nitroglycerin, a baby aspirin.  He will be taken acutely to the cardiac catheterization lab by Dr. Bonnee Quin.  Dictated y:   Guy Franco, P.A. Attending Physician:  Colon Branch DD:  07/14/01 TD:  07/14/01 Job: [id number]

## 2010-10-09 NOTE — H&P (Signed)
NAME:  [name], [name] NO.:  [id number]   MEDICAL RECORD NO.:  [id number]                   PATIENT TYPE:  INP   LOCATION:  3105                                 FACILITY:  [facility]   PHYSICIAN:  Candyce Churn, M.D.          DATE OF BIRTH:  January 16, 1946   DATE OF ADMISSION:  04/13/2002  DATE OF DISCHARGE:                                HISTORY & PHYSICAL   CHIEF COMPLAINT:  My penis began swelling this p.m.   HISTORY OF PRESENT ILLNESS:  A 65 year old male with a history of:  1.  Type  2 diabetes mellitus.  2.  Hypertension.  3.  GERD.  4.  Hypercholesterolemia.  5.  Coronary artery disease with a history of stents  to the circumflex and right coronary artery at the time of a non-Q-wave MI  in February of 2003.  6.  CABG x 5 with LIMA to LAD and four saphenous vein  grafts on February 23, 2002, by Evelene Croon, M.D.  7.  Long history of  alcohol abuse, but stopped in February of 2003.  8.  Long history of  marijuana use, stopped approximately two months ago.   The patient presented to [facility] on March 15, 2002, just 20 days after his CABG with diffuse edema, 2-3+ up to his thighs,  and also bilateral pleural effusions.  He had PND and orthopnea at the time.  He was admitted by Arturo Morton. Riley Kill, M.D., and given IV Lasix and he  apparently diuresed well.  He was discharged home on March 17, 2002.  Follow-up chest x-ray not performed.  Over the past four weeks, peripheral  edema has returned, as well as orthopnea and PND.  His wife states that his  appetite is poor.  He came to the ER tonight because of penis beginning to  swell.  Denies chest pain, fever, chills, and productive cough.  No thigh or  calf pain.  He is admitted now for work-up of peripheral edema and a large  left pleural effusion.   ALLERGIES:  None.   MEDICATIONS:  1. Oxycodone 5/500 mg one to two p.o. q.4-6h. p.r.n.  2. Tylenol 500 mg p.o. p.r.n.  3.  Lasix 40 mg p.o. b.i.d.  4. Multivitamins one daily.  5. Lisinopril 10 mg p.o. q.a.m. and 5 mg q.h.s.  6. Potassium chloride 20 mEq p.o. b.i.d.  7. Aspirin 325 mg daily.  8. Glucovance 1.25/250 mg p.o. b.i.d.  9. Protonix 40 mg daily.  10.      Lipitor 80 mg daily.  11.      Metoprolol 50 mg in the morning and 25 mg in the evening.   PAST MEDICAL HISTORY:  1. Type 2 diabetes mellitus.  2. Hypertension.  3. GERD.  4. Hypercholesterolemia.  5. Long history of alcohol abuse, but stopped in February of 2003.  6. Long history of marijuana  use, stopped approximately two months ago.  7. Renal calculi.  8. Diabetic retinopathy.   PAST SURGICAL HISTORY:  1. Coronary artery disease with a history of stents to the circumflex and     right coronary artery at the time of a non-Q-wave MI in February of 2003.  2. CABG x 5 with LIMA to LAD and four saphenous vein grafts on February 23, 2002, by Evelene Croon, M.D.   FAMILY HISTORY:  Positive for lung cancer in his mother and brother.  His  father died at age 61 of pneumonia and had Alzheimer's disease.   REVIEW OF SYMPTOMS:  As per HPI.   SOCIAL HISTORY:  The patient is married.  He used to work for [employer].  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  He is  currently temporarily disabled.  He, according to his wife, is a very heavy  drinker and a heavy user of marijuana over many years, but stopped in the  last year as per HPI.   PHYSICAL EXAMINATION:  GENERAL APPEARANCE:  A pale-appearing male with mild  respiratory distress sitting up at about 70 degrees.  VITAL SIGNS:  Blood pressure 156/96, temperature 97 degrees, pulse 100 and  regular, respiratory rate 24 and unlabored, O2 saturation 99% on room air.  HEENT:  Normocephalic and atraumatic.  Muddy sclerae are present.  The  oropharynx is moist.  NECK:  Supple without JVD.  CHEST:  Decreased breath sounds on the left.  CARDIAC:  Regular rate and rhythm  without gallop or rub.  ABDOMEN:  Soft and nontender.  Without hepatomegaly or splenomegaly.  GENITOURINARY:  The penis is moderately edematous.  RECTAL:  Exam is not performed.  EXTREMITIES:  The lower extremities revealed 2-3+ edema up to the level of  the upper thighs.  Negative Homan's bilaterally.  His extremities are warm  distally.  NEUROLOGIC:  Nonfocal.  He is alert and oriented.   LABORATORY DATA:  The chest x-ray reveals a very large left pleural effusion  up about four-fifths of the distance to the apex.  He has cardiomegaly.  No  obvious CHF.  It looks as though his heart may be slightly shifted to the  right.  The EKG is pending.  The BNP is 73.7 - normal.  Sodium 134,  potassium 3.1, chloride 100, bicarbonate 26, BUN 23, creatinine 1.4, glucose  146, calcium 8.6.  LFTs are normal, except for a slightly low albumin at  3.2.  The urinalysis is normal.   ASSESSMENT:  A 65 year old male with a very large left pleural effusion.  His right chest is okay.  He also has peripheral edema in his groin and  lower extremities.  I am concerned that he may have a left effusion and  edema from a lung neoplasm and lymphatic obstruction secondary to tumor.  There is no LV dysfunction likely with a normal BNP.  He has a very strong  family history for lung cancer.   PLAN:  1. IV Lasix 80 mg x 1.  2. Plan for thoracentesis in the morning with analysis for cytology and     culture.  3. CT scan of the chest with contrast to assess for possible malignancy.  4. Continue lisinopril, Protonix, Lipitor, metoprolol, and hold Glucovance     secondary to IV contrast material.  5. May need cardiac and CVTS consults.  6.     Regular Insulin sliding scale for diabetes mellitus.  7. Consider  repeating venous Dopplers of the lower extremities to rule out     venous thrombotic disease. 8. Consider abdominal CT to assess for possible neoplasm as well.                                                Candyce Churn, M.D.    RNG/MEDQ  D:  04/14/2002  T:  04/14/2002  Job:  [id number]   cc:   Deirdre Peer. Polite, M.D.  [address]  [place], [place] [address]  Fax: [phone]   Arturo Morton. Riley Kill, M.D. [facility]   Evelene Croon, M.D.  [address]  [place]  [place] [address]  Fax: [phone]

## 2010-10-09 NOTE — Cardiovascular Report (Signed)
   NAME:  [name], [name] NO.:  [id number]   MEDICAL RECORD NO.:  [id number]                   PATIENT TYPE:  INP   LOCATION:  3105                                 FACILITY:  [facility]   PHYSICIAN:  Salvadore Farber, M.D. [facility]         DATE OF BIRTH:  07/24/1945   DATE OF PROCEDURE:  04/16/2002  DATE OF DISCHARGE:                              CARDIAC CATHETERIZATION   PROCEDURE:  Pericardial centesis.   INDICATIONS FOR PROCEDURE:  This patient is a 65 year old gentleman, status  post coronary bypass grafting on February 23, 2002, by Dr. Laneta Simmers.  He  presented with edema and late October and was treated with diuretics.  He  has now represented on November 21 with exertional dyspnea and progressive  edema.  Echocardiogram today demonstrated a large pericardial effusion with  RV compromise suggesting impending tamponade.  He was, therefore, referred  for pericardial centesis.   TECHNIQUE:  Informed consent was obtained.  Under 1% lidocaine local  anesthesia and using electrocardiographic guidance, a needle was passed into  the pericardial space.  A wire was then advanced and the tract dilated.  A 6-  French multihole catheter was advanced over the wire and used to drain the  effusion.  680 cc of serous fluid was removed, reducing the pericardial  pressure from 10 to 0 mmHg.  Samples were sent for cell count, differential,  LDH, gram stain, culture, and cytology.  Following the procedure, the  catheter was removed.  The patient tolerated the procedure well and was  transferred to the holding room in stable condition.   COMPLICATIONS:  None.   IMPRESSION/PLAN:  Successful pericardial centesis.  Will plan on reassessing  with repeat echocardiogram tomorrow to exclude reaccumulation.                                               Salvadore Farber, M.D. [facility]    WED/MEDQ  D:  04/16/2002  T:  04/17/2002  Job:  [id number]   cc:   Pricilla Riffle, M.D. [facility]  [address]  [place]  [place] [address]  Fax: [phone]   Deirdre Peer. Polite, M.D.  [address]  [place], [place] [address]  Fax: [phone]   Charlies Constable, M.D. [facility]  [address]  [place]  [place] [address]

## 2010-10-09 NOTE — Consult Note (Signed)
NAME:  [name], REPKA NO.:  [id number]   MEDICAL RECORD NO.:  [id number]                   PATIENT TYPE:  INP   LOCATION:  3105                                 FACILITY:  [facility]   PHYSICIAN:  Salvadore Farber, M.D. [facility]         DATE OF BIRTH:  09-28-1945   DATE OF CONSULTATION:  04/14/2002  DATE OF DISCHARGE:                                   CONSULTATION   CARDIOLOGIST:  Dr. Juanda Chance with [facility].   CHIEF COMPLAINT:  Lower extremity edema and penile swelling.   HISTORY OF PRESENT ILLNESS:  The patient is a 65 year old very pleasant man  admitted today for the above problems.  He had bypass surgery on February 23, 2002, and now presents with increased recent lower extremity edema and  orthopnea and PND.  He denies chest pain, palpitations, cough, syncope, near  syncope, fevers, chills, but he has had purulent sputum production.  He  presented to the ER after his wife encouraged him to come to the hospital.  After being seen in the ER he was admitted by Dr. Wallace Cullens and underwent an  ultrasound-guided pleurocentesis this morning after having found a large  left greater than right pleural effusion, and the patient also underwent a  CT scan.   PAST MEDICAL HISTORY:  1  CAD.  The patient is status post bypass surgery  with a LIMA to the LAD, SVG to the ramus, SVG to OM, SVG to PD, SVG to PL on  February 23, 2002.  The patient also had stenting of the left circumflex and  RCA in February 2003, after having a non-Q-wave MI.  The patient had a  positive Cardiolite in September 2003.  The patient is known to have a  normal EF by catheterization.  1. Type 2 diabetes.  2. Hypertension.  3. GERD.  4. Hypercholesterolemia.  5. History of alcohol and marijuana use.  This was stopped in February 2003.   ALLERGIES:  None.   CURRENT MEDICATIONS:  1. Oxycodone p.r.n.  2. Tylenol p.r.n.  3. Lasix 40 mg b.i.d.  4. Multivitamin q.d.  5. Lisinopril 10 mg  q.a.m., 5 mg q.p.m.  6. Potassium chloride 20 mEq b.i.d.  7. Aspirin 325 mg q.d.  8. Glucovance 1.25/250 mg b.i.d.  9. Protonix 40 mg q.d.  10.      Lipitor 80 mg q.h.s.  11.      Metoprolol 50 mg q.a.m., 25 mg q.p.m.   SOCIAL HISTORY:  [place]  No  tobacco.  No recent alcohol use.   FAMILY HISTORY:  Positive for lung cancer.   PHYSICAL EXAMINATION:  VITAL SIGNS:  His current weight is 198 pounds.  Temperature 100, blood pressure 130/70, heart rate 89, respirations 25,  saturation 96% on room air.  HEENT:  Within normal limits.  No icterus.  NECK:  Positive JVD to 12 cm with  positive hepatojugular reflux.  No bruits.  No lymphadenopathy.  With 2+ pulses.  CARDIOVASCULAR:  Regular rate and rhythm with tachycardia.  No murmurs,  rubs, or gallops.  No S3 or S4.  CHEST:  Decreased breath sounds bilaterally left greater than right.  No  wheezes, rales, or rhonchi.  ABDOMEN:  Soft and nontender.  No masses.  Normal bowel sounds.  EXTREMITIES:  With 2+ symmetric lower extremity edema.  With 2+ pulses.  The  surgical wounds are all within normal limits.   LABORATORY DATA:  Hemoglobin 9.7, hematocrit 30.1, platelets 262, white  count 5.3.  PTT 30, INR 1.1.  Sodium 134, potassium 4.1, chloride 100,  bicarbonate 26, BUN 23, creatinine 1.2, glucose 146.  LDL 26, cholesterol  73, triglycerides 60, HDL 35.  Urinalysis is negative.  ESR 15.  TSH 1.89.  Calcium is 8.6, albumin 3.2.  CK 54, MB 1.1, BNP 73.7.  Total fluid showed a  glucose of 126.  White count 480, with 97% lymphocytes.  Total protein 2.7.   Chest CT today showed moderate bilateral pleural effusions left greater than  right with significant atelectasis of the left lower lobe and right lower  lobe.  Moderate pericardial effusion and subpleural nodules of the right  upper lobe.  No lymphadenopathy.   An EKG shows normal sinus rhythm, slight decreased voltage, no Q or ST-T  changes.    ASSESSMENT:  The patient is a 65 year old man who is status post coronary  artery bypass graft.  He was recently seen by Dr. Riley Kill for similar  symptoms on March 15, 2002.  At that time the patient was diuresed.  He is  now admitted for volume overload, pleural effusions, pericardial effusion.  However, the patient has a history of a normal ejection fraction and a now-  normal brain natriuretic peptide level.  Most of his laboratories are within  normal limits.  After having undergone a left pleurocentesis the patient  feels much better and is no longer short of breath.   PLAN:  1. Cardiovascular:  I agree with continuing with diuresis with 80 mg IV     b.i.d. with a goal of 2-L diuresis per day.  We will follow his daily     electrolytes, weights, and use a low-sodium diet.  The patient will need     an echo, and we will consider further possibility of diastolic     dysfunction although nutrition may be playing a major role in this     patient's edema, given his low cholesterol levels.  We will follow up on     all of his studies of his sputum, blood cultures, pleural fluid, etc.  As     far as the patient's tachycardia, he is hemodynamically stable but could     probably handle increasing his beta blocker.  We will continue to rule     out the patient for MI and continue his ACE inhibitor.  2. Infectious disease:  It does not appear that the patient has a pneumonia     at this time, but his sputum is certainly worrisome so, until blood     cultures and other cultures are returned, we will continue antibiotics.  3. Nutrition:  We will encourage Boost or Ensure and p.o. intake.  4.     Given his iron deficiency, a colonoscopy may be indicated in the future when     the patient is stabilized.  5. The patient will be followed by  our group with further plans as needed.   Thank you very much for this consultation.                                              Salvadore Farber, M.D.  [facility]    WED/MEDQ  D:  04/14/2002  T:  04/15/2002  Job:  [id number]   cc:   Pearletha Alfred, M.D.  [address]  [place] [address]  Fax: [phone]   Charlies Constable, M.D. [facility]  [address]  [place]  [place] [address]

## 2010-10-09 NOTE — Discharge Summary (Signed)
NAME:  [name], [name]                           ACCOUNT NO.:  [id number]   MEDICAL RECORD NO.:  [id number]                   PATIENT TYPE:  INP   LOCATION:  2010                                 FACILITY:  [facility]   PHYSICIAN:  Evelene Croon, MD                    DATE OF BIRTH:  April 10, 1946   DATE OF ADMISSION:  02/23/2002  DATE OF DISCHARGE:  02/28/2002                                 DISCHARGE SUMMARY   ADMISSION DIAGNOSES:  1. Severe three-vessel coronary artery disease.  2. Unstable angina.   ADDITIONAL DIAGNOSES:  1. Noninsulin dependent diabetes mellitus.  2. Hypertension.  3. Gastroesophageal reflux disease.  4. History of previous percutaneous transluminal coronary angioplasty and     stent placement in February 2003.  5. History of non Q wave myocardial infarction.  6. Hyperlipidemia.   PROCEDURES PERFORMED:  1. Coronary artery bypass grafting x 5, left internal mammary artery to the     left anterior descending artery, saphenous vein graft sequentially to the     intermediate coronary and to the obtuse marginal,  sequential saphenous     vein graft to the posterior descending artery and posterolateral     arteries.  2. Endoscopic vein harvest, right thigh, with open vein harvest left thigh.   HISTORY OF PRESENT ILLNESS:  This is a 65 year old male with a known history  of coronary artery disease. He experienced a non Q wave myocardial  infarction in February of 2003, and at that  time underwent a stenting of  his left circumflex and right coronary artery. He required repeat stent  overlapping for an edge tear and then required a third stent in a separate  procedure distal to the first two stents for recurrent ischemia. Over the  past several weeks he has had exertional chest pressure symptoms. He was  seen again by Dr. Juanda Chance and underwent repeat cardiac catheterization on  February 09, 2002, which showed severe three-vessel coronary artery disease  with in-stent   stenosis of the right coronary. He also had restenosis of his  left circumflex as well. It was recommended that a cardiothoracic surgery  consultation be undertaken. He was seen by Dr. Rexanne Mano in the office  and it was felt that at this time he was  to be a candidate for  surgical  revascularization.   HOSPITAL COURSE:  He was  admitted to [facility] on February 23, 2002, and was taken to the operating room on that day where he underwent a  CABG x 5 with the above named grafts. He tolerated the procedure well and  was transferred to the SICU in stable condition.   Postoperatively he has done well. In the immediate postoperative period he  was  noted to be somewhat anemic with hemoglobin of 8 and some hypotension.  He was transfused 1 unit of packed red blood  cells which brought his  hemoglobin and hematocrit up to 8.6 and 25.4 respectively. His symptoms  resolved. By postoperative day #2 he was ready for transfer to the floor.   He was then started on iron replacement therapy. His blood sugars have  remained stable on Glucotrol  and Lantus. He has remained afebrile and all  vital signs have been stable. He has been diuresed back to within  1 to 2  pounds of his preoperative weight. His incisions are all healing well. He is  ambulating in the halls without difficulty. He has been weaned off  supplemental oxygen and is  maintaining O2 saturations of greater than 98%  on room air. It is felt that if he continues to remain stable he will be  ready for discharge home on February 28, 2002.   DISCHARGE MEDICATIONS:  1. Enteric coated aspirin 325 mg q.d.  2. Lanoxin 0.125 mg q.d.  3. Niferex 750 mg q.d.  4. Colace 100 mg b.i.d.  5. Protonix 40 mg q.d.  6. Lipitor 80 mg q.d.  7. Glucovance 2.5/500 b.i.d.  8. Lisinopril 10 mg q.d.  9. Lopressor 50 mg b.i.d.  10.      Tylox 1 to 2 q.4h. p.r.n. pain.   DISCHARGE INSTRUCTIONS:  He was to refrain from driving,  heavy lifting or   strenuous activity. He may to continue a low fat, low sodium diet. He is  asked to shower daily and  clean his incisions with soap and water.   FOLLOW UP:  He will be seen  in followup by Dr. Juanda Chance in two weeks and is  asked to have a chest x-ray at that appointment. He will then followup with  Dr. Laneta Simmers in the [facility] office  on Tuesday, March 20, 2002, at 9 a.m. He is  asked to bring his chest x-ray to this appointment. He will follow up with  Dr. Dewaine Conger in the next one to  two weeks.                                               Evelene Croon, MD    BB/MEDQ  D:  02/27/2002  T:  03/02/2002  Job:  [id number]   cc:   Charlies Constable, MD [facility]  [address]  [place]  [place] [address]   Colon Flattery  [address]  [place]  [place] [address]  Fax: [phone]

## 2010-10-09 NOTE — Discharge Summary (Signed)
NAME:  [name], [name] NO.:  [id number]   MEDICAL RECORD NO.:  [id number]                   PATIENT TYPE:  INP   LOCATION:  4735                                 FACILITY:  [facility]   PHYSICIAN:  Jackie Plum, M.D.             DATE OF BIRTH:  1946-02-08   DATE OF ADMISSION:  04/13/2002  DATE OF DISCHARGE:  04/19/2002                                 DISCHARGE SUMMARY   DISCHARGE DIAGNOSES:  1. Pericardial effusion with impending tamponade, resolved.  2. Type 2 diabetes mellitus, controlled.  3. Respiratory infection, improved on antibiotics.  4. Normocytic anemia, stable.   DISCHARGE MEDICATIONS:  1. Protonix 40 mg a day.  2. Prinivil 10 mg in a.m., 5 mg in p.m.  3. Enteric-coated aspirin 325 mg q.d.  4. Colace.  5. Stool softener as needed.  6. Multivitamins daily.  7. Iron 325 mg a day.  8. Lopressor 50 mg twice a day.  9. Zocor 20 mg daily.  10.      Lasix 80 mg twice a day.  11.      Tequin 400 mg daily x 3 days.  12.      Glucotrol 5 mg daily.   ALLERGIES:  CODEINE causes nausea and vomiting.   PROCEDURES:  Pericardiocentesis performed on 04/16/2002 indicated large  pericardial effusion with impending tamponade seen on 2-D echocardiogram;  800 cc of serous fluid was removed, reducing the intrapericardial pressure  from 10 mmHg to 0 mmHg.  The procedure was tolerated well, and the patient  did not have any complications.   HISTORY OF PRESENT ILLNESS:  The patient presents to the emergency room  03/15/2002, twenty days after CABG, with diffuse 2 to 3+ edema to thighs and  bilateral pleural effusions and PND and orthopnea.  He was given IV Lasix  and diuresed well.  He was discharged home on 03/17/2002. Followup chest x-  ray was not performed.  Over the next four weeks, the patient's peripheral  edema returned as well as orthopnea and PND. Wife states that appetite is  poor.  Came to the ER on day of presentation because of penile  swelling.  Denies chest pain, fever, chills, thigh or calf pain.  Admitted for workup  of peripheral edema and large pleural effusion.   HOSPITAL COURSE:  The patient was admitted to ICU unit for monitoring,  provided with supplemental O2, IV diuresis with Lasix, maintained on his  beta blocker and his oral hypoglycemics.  EKG at time of admission revealed  normal sinus rhythm with low-voltage QRS.  Chest x-ray showed cardiomegaly,  bilateral edema, large pleural effusion.  CT of the chest was obtained which  showed pleural effusions as well as pericardial effusion with small sub-  centimeter pleural nodules in both upper lobes.  Calcified and most  certainly represented granulomata.  No hilar or mediastinal adenopathy and  postoperative changes as above.  The patient was maintained on telemetry  which showed sinus tachycardia without acute ST changes or sustained ectopy.   A 2-D echocardiogram was performed to evaluate patient's cardiac function  which revealed ejection fraction was moderately depressed, left ventricular  size was normal, and left ventricular wall thickness was mildly increased.  Right ventricular systolic function was mildly reduced.  Left atrium was  mildly dilated, mild thickening of mitral valve.  The inferior vena cava was  moderately dilated, and large pericardial effusion which was circumferential  2 to 3 mm was noted.  Heart was swinging.  Mitral inflow with mild  respiratory variation RA with diastolic indentation, RV with diastolic  indentation, IVC dilated with some respiratory gradation.  Concern for  impending tamponade.  The patient then underwent the above-noted  pericardiocentesis.  Postprocedure chest x-ray revealed significantly  decreased left pleural effusion with continued cardiomegaly and bilateral  pulmonary edema.  No evidence of pneumothorax.   Following these events, the patient's cardiopulmonary status continued to  improve significant.   Vital signs remained stable, and he was able to  ambulate without dyspnea or assistance.   The patient was noted to be anemic on admission with hemoglobin 9.7,  hematocrit 30.1, MCV 74.0.  The patient was found to be iron deficient.  Iron labs showed iron 18, TIBC 336, 5% saturation, ferritin 29.  Other  relevant labs include TSH 1.89.  Total cholesterol 73, triglycerides 60, HDL  35, LDL 26.  BNP 73.7.  Cardiac enzymes were negative.  ESR 15.  The patient  was begun on iron therapy, and should continue with same for approximately  three months at which a CBC should be checked, and patient should be  reevaluated for continued therapy.   The patient did have signs and symptoms of lower respiratory tract infection  as well as questionable chest x-ray findings as noted above.  The patient  was started on IV antibiotics at time of admission and changed to p.o.  Tequin on 04/16/2002.  He tolerated this well and will complete course at  time of discharge.   The patient was maintained on oral hypoglycemic regimen during admission.  Blood sugar ranged from 49 to 153.  He will need to follow up with his  primary care physician regarding diabetes.  Hemoglobin A1C on discharge was  5.9.   On day of discharge, the patient is free of edema, respiratory distress,  tachycardia, fever, shortness of breath.  Lungs are clear.  The patient is  to follow up with Dr. Juanda Chance regarding management of his fluid volume  status.   DISCHARGE LABORATORY DATA:  Sodium 138, potassium 3.6, chloride 107, CO2 27,  BUN 20, creatinine 1.2, calcium 8.0.  CBC as noted above.   CONSULTATIONS:  Salvadore Farber, M.D., cardiology.   CONDITION ON DISCHARGE:  Substantially improved, stable.    DISPOSITION:  Discharge to home.   FOLLOW UP:  Dr. Regino Schultze office is to call the patient for followup  appointment in two weeks' time.    Ellender Hose. Christian Mate, M.D.    SMD/MEDQ  D:   05/15/2002  T:  05/16/2002  Job:  [id number]   cc:   Charlies Constable, M.D. [facility]  [address]  [place]  [place] [address]   Dr. Colon Flattery, Adline Peals, M.D. [facility]  [facility] [facility]  [address]  [place], [place] [address]  Fax: [phone]

## 2010-10-09 NOTE — Discharge Summary (Signed)
[place]  Patient:    [name], [name] Visit Number: [id number] MRN: [id number]          Service Type: MED Location: [phone] Attending Physician:  Ronaldo Miyamoto Dictated by:   Pennelope Bracken, R.N. Admit Date:  07/14/2001 Discharge Date: 07/18/2001   CC:         Arturo Morton. Riley Kill, M.D. [facility]  Colon Flattery, D.O.   Discharge Summary  REASON FOR ADMISSION: Re-look status post percutaneous transluminal coronary angioplasty of circumflex, then the distal right coronary artery, return for recurrent chest pain with subtotal distal right coronary artery thrombus.  DISCHARGE DIAGNOSES:  1. Coronary artery disease, status post multiple percutaneous interventions     as described above, with a new 90% stenosis in the distal right coronary     artery, which is distal to two previous stents, with successful stenting     on this admission.  2. Diabetes mellitus.  3. Hyperlipidemia.  4. Hypertension.  HISTORY OF PRESENT ILLNESS: This delightful 65 year old gentleman, with medical history as described above, was readmitted having some chronic and recurrent substernal chest pain subsequent to multiple recent stent placements.  He described substernal chest pain and pressure that woke him at 4 a.m. that was not fully relieved with two sublingual nitroglycerin.  He did experience some accompanying diaphoresis with this chest pain, and was admitted for further evaluation.  HOSPITAL COURSE: The patient was stabilized, continued on home meds, and taken emergently to the catheterization laboratory for evaluation.  The procedure was performed without complications.  Findings were as follows.  Residual thrombus was seen distal in both branches of the right coronary artery.  A second stent was deployed beyond the previously placed stent and residual thrombus was then dilated with a balloon.  The patient was continued on Integrelin for 48 hours, and  returned to the Floor in good condition.  The patient continued to be chest pain and dyspnea-free throughout his hospitalization.  He did develop a rash on his abdomen, which was in place prior to admission but began to spread after his catheterization.  This was treated with topical hydrocortisone with good result.  He was seen by the diabetes coordinator and his diabetic medications were modified to cover the changes secondary to his hospitalization.  He was referred by the diabetes coordinator to the [facility] diabetic program for checking his CBGs. On day four he was seen by Dr. Antoine Poche and a left groin bruit was heard, and there was a multi-lobe density felt at the left femoral arterial stick site. An ultrasound was obtained and no AV fistula or pseudoaneurysm was found.  DISCHARGE PHYSICAL EXAMINATION:  GENERAL: On the day of discharge the patient was in no acute distress.  VITAL SIGNS: Blood pressure 150/75, pulse 88, respirations 20.  Pulse oximetry 97% on room air.  CARDIOVASCULAR: Regular rate and rhythm.  S1, S2, and S4.  Telemetry revealed normal sinus rhythm.  LUNGS: Clear to auscultation bilaterally.  BACK: Back and left side trunk displayed a diffuse maculopapular rash.  EXTREMITIES: Without clubbing, cyanosis, or edema.  LABORATORY DATA: The patients most recent hemogram is as follows - WBC 6.4, hemoglobin 11.8, hematocrit 33.9.  Most recent chemistries reveal sodium of 134, potassium 3.9, chloride 100, CO2 25, glucose high at 231, BUN 14, creatinine 1.1.  Calcium was 8.7.  Cardiac markers drawn on July 14, 2001 revealed a CK of 95 and troponin of 0.34.  This represented a reduction from  previous troponin I of 1.70 the day before.  Lipid profile revealed a total cholesterol of 103, triglyceride 101, HDL low at 30, and LDL 53.  Hepatitis acute panel was negative for surface antigen, anti-HCV, anti-HAV, and hep-B core antigen.  Chest x-ray revealed  mild cardiomegaly, and final 12 lead EKG revealed normal sinus rhythm with ventricular rate of 86, with changes suggestive of old inferior/posterior infarct.  DISPOSITION: The patient is discharged home in the care of his wife.  DISCHARGE MEDICATIONS:  1. Lisinopril 10 mg one q.d.  2. Plavix 75 mg one q.d.  3. Zocor 40 mg one q.d.  4. Aspirin 325 mg one q.d.  5. Restart Glucovance at prior dose per diabetes coordinator.  6. Isordil 20 mg one t.i.d.  7. Protonix 40 mg one q.d.  8. Foltx one q.d.  9. Lopressor 25 mg 1-1/2 tablet b.i.d.  DISCHARGE ACTIVITY: No heavy lifting, strenuous activity, or driving for two days.  DISCHARGE DIET: Low-fat/low-cholesterol diabetic diet.  WOUND CARE: The patient is to call the office if the groin wound becomes hard or painful.  He is instructed to apply warm pressure to reduce hematoma there.  FOLLOW-UP: The patient has been scheduled and agrees to attend [facility] diabetes program.  He will see Dr. Juanda Chance Thursday, August 17, 2001, at 3:15 at the [facility] clinic.  He knows to call in the interim for any problems, questions, concerns, change or increase in symptoms. Dictated by:   Pennelope Bracken, R.N. Attending Physician:  Ronaldo Miyamoto DD:  07/18/01 TD:  07/18/01 Job: [id number]

## 2010-10-09 NOTE — Discharge Summary (Signed)
[place]  Patient:    [name], [name] Visit Number: [id number] MRN: [id number]          Service Type: CAT Location: [phone] Attending Physician:  Colon Branch Dictated by:   Joellyn Rued, P.A.-C. Admit Date:  07/07/2001 Disc. Date: 07/08/01   CC:         Cecil Cranker, M.D. [facility]  Colon Flattery, D.O.  Beulah Gandy. Ashley Royalty, M.D.   Referring Physician Discharge [place]  DATE OF BIRTH:  Sep 27, 1945  SUMMARY OF HISTORY:  Mr. Breck is a 65 year old white male, who was referred to our office on the 14th by Dr. Dewaine Conger in regard to chest discomfort.  He described at least an eight month history of exertional chest discomfort which he described at tightness, did not radiate, nor was it associated with short of breath, nausea, vomiting, or diaphoresis.  It would come on with exertion, relieve promptly with rest.  He has not had any rest or nocturnal episodes. The discomfort would only last a minute or two and may occur every other day, once a week.  He stated that it was very hard to predict.  He has not had to use any nitroglycerin that was recently given to him by Dr. Dewaine Conger.  He did take a baby aspirin on the morning of our consultation.  His history is notable for non-insulin dependent diabetes, diabetic retinopathy for which he sees Dr. Alan Mulder.  He has had multiple laser eye surgeries and cataract problems.  He also has a history of hypertension.  He describes increased stress.  He also has a remote history of tobacco use and currently uses alcohol frequently.  LABORATORY DATA:  Postprocedure, sodium was 133, potassium 3.9, BUN 16, creatinine 1.0, glucose 179.  H&H 14.1 and 40.9, normal indices, platelets 206, WBC 8.4.  CK was 125; MB is 6.5.  EKG showed normal sinus rhythm, nonspecific ST-T wave changes.  HOSPITAL COURSE:  Mr. Squier came in for outpatient cardiac catheterization on July 07, 2001.  According  to Dr. Remo Lipps progress note, he has three-vessel coronary artery disease, a 70% proximal ramus, 50% mid LAD, distal diffuse irregularities in the LAD, 70% diagonal 2.  The circumflex had a 50-90% proximal lesion, 40% mid lesion involving a branch, and an 80% distal lesion.  The RCA had a 50% distal lesion, 40% distal lesion, 90% distal lesion just prior to the PDA bifurcation, 70% PDA lesion.  His ejection fraction was 60%.  Dr. Juanda Chance performed stenting to the proximal circumflex and the distal circumflex, reducing the 90 and 80% lesion to less than 10%.  Catheterization site was closed with Perclose.  He did have some bleeding through the Perclose dressing, and this was changed.  He did not have any further problems.  On July 08, 2001, he was ambulating without difficulty and once his Integrilin was finished, he was discharged home.  DISCHARGE DIAGNOSES: 1. Unstable angina. 2. Three-vessel coronary artery disease, status post angioplasty stenting, as    previously described. 3. History as previously.  DISPOSITION:  He is discharged home.  He was instructed not to resume his Glucovance, as that he will be returning to short-stay section C on Monday at 12 p.m. for catheterization at 2 p.m. for intervention on his RCA.  He was instructed he may have a liquid breakfast then nothing to eat or drink; however, he may take his medications.  OTHER MEDICATIONS: 1. Plavix 75 mg q.d. x  1 month. 2. Lisinopril 10 mg q.d. 3. Lopressor 50 mg 1/2 tab b.i.d. 4. Coated aspirin 81 mg q.d. 5. Lipitor 20 mg q.h.s. 6. Sublingual nitroglycerin as needed.  DISCHARGE INSTRUCTIONS: 1. He was advised no lifting, driving, sexual activity, or heavy exertion for    two days. 2. Maintain a low salt/fat/cholesterol ADA diet. 3. He was given permission to remove the Perclose dressing on Sunday and    showering. 4. If he had any problems with his catheterization site, he was instructed to    call  immediately. 5. He was advised no alcohol, smoking or tobacco products. 6. He will need lipids and LFTs in six weeks. 7. When he returns to the short-stay center on Monday for repeat    catheterization and intervention, I will also check a hemoglobin A1c and a    TSH and a chest x-ray. Dictated by:   Joellyn Rued, P.A.-C. Attending Physician:  Colon Branch DD:  07/08/01 TD:  07/08/01 Job: [id number]

## 2010-10-09 NOTE — Cardiovascular Report (Signed)
[place]  Patient:    [name], [name] Visit Number: [id number] MRN: [id number]          Service Type: CAT Location: [id number] Attending Physician:  Colon Branch Dictated by:   Everardo Beals Juanda Chance, M.D. [facility] Proc. Date: 07/07/01 Admit Date:  07/07/2001 Discharge Date: 07/08/2001   CC:         Colon Flattery, D.O.  Cecil Cranker, M.D. [facility]  John D. Ashley Royalty, M.D.  Cardiac Catheterization Lab   Cardiac Catheterization  CLINICAL HISTORY:  Mr. Winton is a 65 year old gentleman who lives in [place] and works in [place] in [employer].  He has recently had chest pain and was seen by Dr. Dewaine Conger in the office and sent to Dr. Glennon Hamilton in our office and he was admitted with symptoms suggestive of an acute coronary syndrome.  He was scheduled for a catheterization today.  He has diabetes and hypertension.  DESCRIPTION OF PROCEDURE:  The procedure was performed via the right femoral artery using an arterial sheath and #6 [place] preformed coronary catheters.  A front wall arterial puncture was performed and Omnipaque contrast was used. At the completion of the diagnostic study, we made a decision to proceed with intervention on the circumflex artery.  The patient was given weight-adjusted heparin to prolong an ACT of greater than 200 seconds and was given double bolus Integrilin and infusion.  We used an AVE Tour[employer] information centre manager which was a 4.0 and similar to a Voda catheter and we used a short luge wire.  We crossed the lesion in the proximal circumflex without difficulty.  We direct-stented with a 2.25- x 12-mm Express stent deploying this with one inflation of 15 atmospheres for 45 seconds.  This resulted in a distal edge tear and there was segmental disease distally so we deployed a second stent which was a 2.5- x 18-mm Pixel.  We deployed this with two inflations of 12 and 14 atmospheres for 40 and 35 seconds overlapping  the first stent.  We had not initially planned to treat a distal lesion but after doing the proximal lesion, the distal lesion appeared to be worse, so we made a decision to treat this and we treated this with a 2.5- x 13-mm Pixel deploying this with two inflations of 10 and 12 atmospheres up to 38 seconds.  Repeat diagnostic studies were then performed through the guiding catheter.  We also post dilated within the proximal stent with a 2.5- x 8-mm Quantum Maverick performing two inflations of 15 and 14 atmospheres for 35 seconds each. Repeat diagnostic studies were then performed through the guiding catheter. The patient tolerated the procedure well and left the laboratory in satisfactory condition.  We initially had planned to do the right coronary artery but the circumflex artery took a long time and used quite a bit of contrast and we elected to stage the right coronary artery.  RESULTS: 1. Left main coronary artery:  The left main coronary artery was free of    significant disease. 2. Left anterior descending artery:  The left anterior descending artery gave    rise to two diagonal branches and four septal perforators.  There was 50%    narrowing in the proximal LAD and 70% narrowing in the second diagonal    branch.  The LAD was diffusely irregular and had diffuse disease but no    severe obstruction. 3. Left circumflex coronary artery:  The left circumflex coronary artery  gave    rise to an intermedius branch, a marginal branch, and an AV branch.  There    was 70% narrowing in the intermedius branch.  There was a 90% stenosis in    the proximal circumflex artery before the bifurcation of the marginal and    AV branch and with some segmental disease extending into the marginal    branch.  There was also an 80% stenosis in the mid portion of the marginal    branch. 4. Right coronary artery:  The right coronary artery is a moderate sized    vessel that gave rise to a posterior  descending and two posterolateral    branches.  There were 50% and 40% stenoses distally and there was a 90%    stenosis just before the posterior descending branch.  The posterior    descending branch was a small vessel and had a 78% proximal or ostial    stenosis.  LEFT VENTRICULOGRAPHY:  Left ventriculogram performed in the RAO projection showed good wall motion with no areas of hypokinesis.  Following placement of tandem overlying stents in the proximal circumflex artery, the stenosis improved from 90% to less than 10% and following stenting of the lesion in the mid portion of the marginal branch, the stenosis improved from 80% to less than 10%.  There was no residual dissection.  CONCLUSIONS: 1. Coronary artery disease with 50% narrowing in the proximal to mid left    anterior descending artery and 70% narrowing in a diagonal branch, 70%    narrowing in a circumflex intermedius vessel with 90% stenosis in the    proximal circumflex artery and 80% stenosis in the mid portion of the    marginal branch, a 50% distal stenosis in the right coronary artery with    90% distal stenosis just before the posterior descending branch and 70%    stenosis at the ostium of the posterior descending branch and normal left    ventricular function. 2. Successful placement of tandem overlying stents in the proximal portion of    the circumflex artery (second stent for edge tear) with improvement in    percent area narrowing from 90% to less than 10%. 3. Successful stenting of the lesion in the mid circumflex marginal vessel    with improvement in percent area narrowing from 80% to less than 10%.  DISPOSITION:  The patient was returned to the post angioplasty unit for further observation.  Will plan to perform percutaneous intervention on the  right coronary artery on Monday and probably let the patient go home tomorrow and come back on Monday.  ADDENDUM:  The patient was given weight-adjusted  heparin to prolong an ACT greater than 200 seconds and was given double bolus Integrilin and infusion during the intervention. Dictated by:   Everardo Beals Juanda Chance, M.D. [facility] Attending Physician:  Colon Branch DD:  07/07/01 TD:  07/08/01 Job: [id number]

## 2010-10-09 NOTE — H&P (Signed)
NAME:  [name], [name] NO.:  [id number]   MEDICAL RECORD NO.:  [id number]                   PATIENT TYPE:  INP   LOCATION:  2036                                 FACILITY:  [facility]   PHYSICIAN:  Arturo Morton. Riley Kill, M.D. [facility]         DATE OF BIRTH:  [birth year]   DATE OF ADMISSION:  03/15/2002  DATE OF DISCHARGE:                                HISTORY & PHYSICAL   CHIEF COMPLAINT:  Shortness of breath.   HISTORY OF PRESENT ILLNESS:  The patient is a 65 year old white male who was  referred to the emergency room by our office.  The patient has a history of  coronary artery bypass graft surgery recently, which was uncomplicated.  Since that time, he has noticed some increase in swelling.  He saw the CVTS  and was felt to be stable and told that the swelling would eventually  resolve.  Chest x-ray done in the [facility] Office revealed bilateral pleural  effusions and he was started on Lasix three days ago as well as potassium.  He has not had significant improvement and gained 15 pounds since discharge.  He admits to some orthopnea and PND over the past three days.  Otherwise, he  says he feels really good. In the emergency room he was given 80 mg of  intravenous furosemide with urine output approximately 1900 cc.   He had prior stenting to the circumflex in February.  He had a positive  Cardiolite in September.  Catheterization February 09, 2002, revealed  recurrent disease with ejection fraction 55% and inferior hypokinesis.  Dr.  Laneta Simmers did the bypass and he had left internal mammary to the LAD, saphenous  vein graft to the ramus intermedius and OM, saphenous vein graft to the PD  and PL.  He was discharged home in five days.   PAST MEDICAL HISTORY:  1. Non-insulin dependent diabetes for 20 years.  2. Hyperlipidemia.  3. Hypertension.  4. Gastroesophageal reflux.   FAMILY HISTORY:  Mother died of a brain tumor, father died of Alzheimer's.  He has a one  brother, 57, who died of lung cancer.   SOCIAL HISTORY:  [place]  He is married with  one child.  He does not smoke.  He does not drink.  He has been eating more  salt in his diet recently.   REVIEW OF SYSTEMS:  Remarkable for shortness of breath, orthopnea and PND.  It is otherwise negative.   ALLERGIES:  He has no known drug allergies.   MEDICATIONS:  1. Enteric-coated aspirin 325 mg daily.  2. Lanoxin 0.0125 mg daily  3. Niferex 750 daily  4. Colace 100 mg p.o. b.i.d.  5. Protonix 40 mg daily  6. Lipitor 80 mg daily  7. Glucovance 1.25/250 p.o. b.i.d.  8. Lisinopril 10 mg daily  9. Lopressor 50 mg b.i.d.  10.      Tylox  p.r.n.  11.      Lasix 40 mg b.i.d. x3 days then daily  12.      Kay Ciel 20 mg p.o. b.i.d. x3 days.  13.      Lorazepam 30 mg q.h.s.   PHYSICAL EXAMINATION:  GENERAL:  He is an alert, slight sallow male.  VITAL SIGNS:  Temperature 97.6, pulse 100, respiratory rate 22, blood  pressure 136/72. SaO2 is 88% on room air.  HEENT:  Examination is unremarkable.  NECK:  There is not obvious, no jugular venous distention.  LUNGS:  The lung fields are clear to auscultation and percussion.  CARDIAC:  The cardiac rhythm is regular with a moderately rapid rate and S4  gallop.  ABDOMEN:  Soft and I cannot feel ____________.  EXTREMITY:  Reveals 2-3+ edema up to the thighs.  NEUROLOGIC:  Examination is intact.   LABORATORY AND ANCILLARY DATA:  Chest x-ray, October 23 reveals increased  pleural effusion bilaterally.   ECG reveals rate 84, normal sinus rhythm, normal axis.  No change from  previous tracing.   Hemoglobin 9.7, hematocrit 29.  BUN 20, creatinine 1.2, glucose 85,  potassium 4.2.  A BMP of 70.7, total protein 6.5, albumin 3.4.  Troponins  and CK-MBs are negative.   IMPRESSION:  1. Volume overload, possibly secondary to salt intake.  2. Prior coronary artery bypass graft surgery February 23, 2002.   PLAN:  1. Admit for  diuresis.  2.     Watch electrolytes carefully during this process.  3. Venous Dopplers to exclude venous disease.  4. Lovenox 40 mg subcutaneous while in the hospital.                                               Arturo Morton. Riley Kill, M.D. [facility]    TDS/MEDQ  D:  03/15/2002  T:  03/16/2002  Job:  [id number]   cc:   Charlies Constable, MD [facility]  [address]  [place]  [place] [address]   Evelene Croon, MD  [address]  [place]  [place] [address]  Fax: [phone]

## 2011-03-04 ENCOUNTER — Other Ambulatory Visit: Payer: Self-pay | Admitting: Urology

## 2011-03-04 ENCOUNTER — Encounter (HOSPITAL_COMMUNITY): Payer: Medicare Other

## 2011-03-04 LAB — BASIC METABOLIC PANEL
BUN: 20 mg/dL (ref 6–23)
CO2: 28 mEq/L (ref 19–32)
Calcium: 9.7 mg/dL (ref 8.4–10.5)
Chloride: 103 mEq/L (ref 96–112)
Creatinine, Ser: 0.98 mg/dL (ref 0.50–1.35)
GFR calc Af Amer: >90 mL/min (ref >90)
GFR calc non Af Amer: 84 mL/min — ABNORMAL LOW (ref >90)
Glucose, Bld: 67 mg/dL — ABNORMAL LOW (ref 70–99)
Potassium: 4.3 mEq/L (ref 3.5–5.1)
Sodium: 138 mEq/L (ref 135–145)

## 2011-03-04 LAB — CBC
HCT: 40.8 % (ref 39.0–52.0)
Hemoglobin: 13.1 g/dL (ref 13.0–17.0)
MCH: 28.6 pg (ref 26.0–34.0)
MCHC: 32.1 g/dL (ref 30.0–36.0)
MCV: 89.1 fL (ref 78.0–100.0)
Platelets: 152 10*3/uL (ref 150–400)
RBC: 4.58 MIL/uL (ref 4.22–5.81)
RDW: 13.2 % (ref 11.5–15.5)
WBC: 4.8 10*3/uL (ref 4.0–10.5)

## 2011-03-04 LAB — SURGICAL PCR SCREEN
MRSA, PCR: NEGATIVE
Staphylococcus aureus: NEGATIVE

## 2011-03-08 ENCOUNTER — Other Ambulatory Visit: Payer: Self-pay | Admitting: Urology

## 2011-03-08 ENCOUNTER — Ambulatory Visit (HOSPITAL_COMMUNITY)
Admission: RE | Admit: 2011-03-08 | Discharge: 2011-03-09 | Disposition: A | Discharge status: Home / Self Care | Payer: Medicare Other | Source: Ambulatory Visit | Attending: Urology | Admitting: Urology

## 2011-03-08 DIAGNOSIS — I252 Old myocardial infarction: Secondary | ICD-10-CM | POA: Insufficient documentation

## 2011-03-08 DIAGNOSIS — I10 Essential (primary) hypertension: Secondary | ICD-10-CM | POA: Insufficient documentation

## 2011-03-08 DIAGNOSIS — I2510 Atherosclerotic heart disease of native coronary artery without angina pectoris: Secondary | ICD-10-CM | POA: Insufficient documentation

## 2011-03-08 DIAGNOSIS — Z951 Presence of aortocoronary bypass graft: Secondary | ICD-10-CM | POA: Insufficient documentation

## 2011-03-08 DIAGNOSIS — Z79899 Other long term (current) drug therapy: Secondary | ICD-10-CM | POA: Insufficient documentation

## 2011-03-08 DIAGNOSIS — Z01812 Encounter for preprocedural laboratory examination: Secondary | ICD-10-CM | POA: Insufficient documentation

## 2011-03-08 DIAGNOSIS — N401 Enlarged prostate with lower urinary tract symptoms: Secondary | ICD-10-CM | POA: Insufficient documentation

## 2011-03-08 DIAGNOSIS — C61 Malignant neoplasm of prostate: Secondary | ICD-10-CM | POA: Insufficient documentation

## 2011-03-08 DIAGNOSIS — N320 Bladder-neck obstruction: Secondary | ICD-10-CM | POA: Insufficient documentation

## 2011-03-08 DIAGNOSIS — N138 Other obstructive and reflux uropathy: Secondary | ICD-10-CM | POA: Insufficient documentation

## 2011-03-08 DIAGNOSIS — Z01810 Encounter for preprocedural cardiovascular examination: Secondary | ICD-10-CM | POA: Insufficient documentation

## 2011-03-08 DIAGNOSIS — E119 Type 2 diabetes mellitus without complications: Secondary | ICD-10-CM | POA: Insufficient documentation

## 2011-03-08 DIAGNOSIS — Z7982 Long term (current) use of aspirin: Secondary | ICD-10-CM | POA: Insufficient documentation

## 2011-03-08 LAB — GLUCOSE, CAPILLARY
Glucose-Capillary: 77 mg/dL (ref 70–99)
Glucose-Capillary: 126 mg/dL — ABNORMAL HIGH (ref 70–99)
Glucose-Capillary: 144 mg/dL — ABNORMAL HIGH (ref 70–99)
Glucose-Capillary: 122 mg/dL — ABNORMAL HIGH (ref 70–99)
Glucose-Capillary: 120 mg/dL — ABNORMAL HIGH (ref 70–99)

## 2011-03-08 LAB — HEMOGLOBIN A1C
Hgb A1c MFr Bld: 6.2 % — ABNORMAL HIGH (ref <5.7)
Mean Plasma Glucose: 131 mg/dL — ABNORMAL HIGH (ref <117)

## 2011-03-09 LAB — GLUCOSE, CAPILLARY
Glucose-Capillary: 82 mg/dL (ref 70–99)
Glucose-Capillary: 142 mg/dL — ABNORMAL HIGH (ref 70–99)

## 2011-03-10 NOTE — Op Note (Signed)
NAMEHARLEM, BULA NO.:  [id number]  MEDICAL RECORD NO.:  [id number]  LOCATION:  DAYL                         FACILITY:  [facility]  PHYSICIAN:  Stevie Ertle C. Vernie Ammons, M.D.  DATE OF BIRTH:  March 08, 1946  DATE OF PROCEDURE:  03/08/2011 DATE OF DISCHARGE:                              OPERATIVE REPORT   PREOPERATIVE DIAGNOSIS:  Benign prostatic hypertrophy with outlet obstruction.  POSTOPERATIVE DIAGNOSIS:  Benign prostatic hypertrophy with outlet obstruction.  PROCEDURE:  Transurethral resection of prostate.  SURGEON:  Dinero Chavira C. Vernie Ammons, M.D.  ANESTHESIA:  General.  DRAINS:  24-French 3-way Foley catheter.  SPECIMENS:  Prostate chips to Pathology.  BLOOD LOSS:  Minimal.  COMPLICATIONS:  None.  INDICATIONS:  The patient is a 65 year old male with bladder outlet obstruction who has been on maximum medical management.  Despite this, he continues to have outlet obstructive symptoms that are bothersome to him.  We discussed the treatment options and he initially elected to proceed with continued medical management, but after further thought and having the procedure of TURP discussed with him in detail including the likelihood of success, the risks and complications as well as alternatives, he has elected to proceed with surgical treatment.  DESCRIPTION OF OPERATION:  After informed consent, the patient was brought to the major OR, placed on the table, administered general anesthesia and then moved to the dorsal lithotomy position.  He had received 400 mg of Cipro IV and his genitalia was sterilely prepped and draped after which an official time-out was performed.  I initially sounded the meatus with Sissy Hoff sounds and found it to be a little snug at 28-French.  I was able to gently dilate him up to 66- [place] and then passed the 28-French resectoscope sheath with Timberlake obturator down to the bulbar urethra and then inserted the 12-degree lens with  resectoscope element.  I visualized the urethra and it was noted to be normal.  The prostatic urethra revealed bilobar hypertrophy with no median lobe component.  The bladder was then entered and fully and systematically inspected.  Ureteral orifices were noted to be of normal configuration and position and well away from the bladder neck. The bladder had 1+ trabeculation.  No tumor, stones, or inflammatory lesions were identified.  I began resecting the left lobe first by resecting from the level of the bladder neck back to the level of the veru starting at the 6 o'clock position and resecting the adenomatous tissue down to the prostatic capsule from inferior to superior.  After resecting the entire left lobe,  I then resected the right lobe in an identical fashion.  Bleeding points were cauterized as they were identified and all of the prostate chips were then removed from the bladder with the Microvasive evacuator. I then reinspected the bladder and noted it to be intact with no prostate chips present and no active bleeding from the prostatic fossa. All resection took place proximal to the veru.  The prostate prior to the procedure and then after the procedure was photographed.  The resectoscope was therefore removed and the 24-French three-way Foley catheter placed in the bladder.  The irrigant returned clear and this was  connected to closed system drainage and continuous bladder irrigation.  The patient was then awakened and taken to recovery room in stable and satisfactory condition.  He tolerated the procedure well. There were no intraoperative complications.     Shirlyn Savin C. Vernie Ammons, M.D.     MCO/MEDQ  D:  03/08/2011  T:  03/08/2011  Job:  [id number]  Electronically Signed by Ihor Gully M.D. on 03/10/2011 07:20:13 AM

## 2011-09-03 ENCOUNTER — Encounter (INDEPENDENT_AMBULATORY_CARE_PROVIDER_SITE_OTHER): Payer: Medicare Other | Admitting: Ophthalmology

## 2011-09-03 DIAGNOSIS — E1139 Type 2 diabetes mellitus with other diabetic ophthalmic complication: Secondary | ICD-10-CM

## 2011-09-03 DIAGNOSIS — H43819 Vitreous degeneration, unspecified eye: Secondary | ICD-10-CM

## 2011-09-03 DIAGNOSIS — E11359 Type 2 diabetes mellitus with proliferative diabetic retinopathy without macular edema: Secondary | ICD-10-CM

## 2011-11-08 ENCOUNTER — Encounter (INDEPENDENT_AMBULATORY_CARE_PROVIDER_SITE_OTHER): Payer: Self-pay | Admitting: Ophthalmology

## 2011-12-02 ENCOUNTER — Other Ambulatory Visit (INDEPENDENT_AMBULATORY_CARE_PROVIDER_SITE_OTHER): Payer: Self-pay | Admitting: General Surgery

## 2012-09-11 ENCOUNTER — Ambulatory Visit (INDEPENDENT_AMBULATORY_CARE_PROVIDER_SITE_OTHER): Payer: Medicare Other | Admitting: Ophthalmology

## 2012-09-11 DIAGNOSIS — E1139 Type 2 diabetes mellitus with other diabetic ophthalmic complication: Secondary | ICD-10-CM

## 2012-09-11 DIAGNOSIS — E11359 Type 2 diabetes mellitus with proliferative diabetic retinopathy without macular edema: Secondary | ICD-10-CM

## 2012-09-11 DIAGNOSIS — H2700 Aphakia, unspecified eye: Secondary | ICD-10-CM

## 2012-09-11 DIAGNOSIS — H43819 Vitreous degeneration, unspecified eye: Secondary | ICD-10-CM

## 2012-09-11 DIAGNOSIS — H35039 Hypertensive retinopathy, unspecified eye: Secondary | ICD-10-CM

## 2012-09-11 DIAGNOSIS — I10 Essential (primary) hypertension: Secondary | ICD-10-CM

## 2013-02-27 ENCOUNTER — Encounter: Payer: Self-pay | Admitting: Internal Medicine

## 2013-03-20 ENCOUNTER — Ambulatory Visit (AMBULATORY_SURGERY_CENTER): Payer: Self-pay | Admitting: *Deleted

## 2013-03-20 VITALS — Ht 71.0 in | Wt 232.4 lb

## 2013-03-20 DIAGNOSIS — Z1211 Encounter for screening for malignant neoplasm of colon: Secondary | ICD-10-CM

## 2013-03-20 MED ORDER — MOVIPREP 100 G PO SOLR
1.0000 | Freq: Once | ORAL | Status: DC
Start: 2013-03-20 — End: 2013-07-04

## 2013-03-20 NOTE — Progress Notes (Signed)
Denies allergies to eggs or soy products. Denies complications with sedation or anesthesia. 

## 2013-03-22 ENCOUNTER — Encounter: Payer: Self-pay | Admitting: Internal Medicine

## 2013-04-03 ENCOUNTER — Encounter: Payer: Medicare Other | Admitting: Internal Medicine

## 2013-07-04 ENCOUNTER — Encounter (HOSPITAL_COMMUNITY): Payer: Self-pay | Admitting: Emergency Medicine

## 2013-07-04 ENCOUNTER — Emergency Department (HOSPITAL_COMMUNITY)
Admission: EM | Admit: 2013-07-04 | Discharge: 2013-07-05 | Disposition: A | Discharge status: Home / Self Care | Payer: Medicare HMO | Attending: Emergency Medicine | Admitting: Emergency Medicine

## 2013-07-04 DIAGNOSIS — R142 Eructation: Secondary | ICD-10-CM | POA: Insufficient documentation

## 2013-07-04 DIAGNOSIS — Z951 Presence of aortocoronary bypass graft: Secondary | ICD-10-CM | POA: Insufficient documentation

## 2013-07-04 DIAGNOSIS — R339 Retention of urine, unspecified: Secondary | ICD-10-CM | POA: Insufficient documentation

## 2013-07-04 DIAGNOSIS — Z792 Long term (current) use of antibiotics: Secondary | ICD-10-CM | POA: Insufficient documentation

## 2013-07-04 DIAGNOSIS — R141 Gas pain: Secondary | ICD-10-CM | POA: Insufficient documentation

## 2013-07-04 DIAGNOSIS — R319 Hematuria, unspecified: Secondary | ICD-10-CM | POA: Insufficient documentation

## 2013-07-04 DIAGNOSIS — R300 Dysuria: Secondary | ICD-10-CM | POA: Insufficient documentation

## 2013-07-04 DIAGNOSIS — R143 Flatulence: Secondary | ICD-10-CM

## 2013-07-04 DIAGNOSIS — K219 Gastro-esophageal reflux disease without esophagitis: Secondary | ICD-10-CM | POA: Insufficient documentation

## 2013-07-04 DIAGNOSIS — Z79899 Other long term (current) drug therapy: Secondary | ICD-10-CM | POA: Insufficient documentation

## 2013-07-04 DIAGNOSIS — E119 Type 2 diabetes mellitus without complications: Secondary | ICD-10-CM | POA: Insufficient documentation

## 2013-07-04 DIAGNOSIS — I10 Essential (primary) hypertension: Secondary | ICD-10-CM | POA: Insufficient documentation

## 2013-07-04 DIAGNOSIS — E785 Hyperlipidemia, unspecified: Secondary | ICD-10-CM | POA: Insufficient documentation

## 2013-07-04 DIAGNOSIS — Z7982 Long term (current) use of aspirin: Secondary | ICD-10-CM | POA: Insufficient documentation

## 2013-07-04 DIAGNOSIS — Z862 Personal history of diseases of the blood and blood-forming organs and certain disorders involving the immune mechanism: Secondary | ICD-10-CM | POA: Insufficient documentation

## 2013-07-04 DIAGNOSIS — Z87448 Personal history of other diseases of urinary system: Secondary | ICD-10-CM | POA: Insufficient documentation

## 2013-07-04 LAB — URINALYSIS, ROUTINE W REFLEX MICROSCOPIC
Glucose, UA: NEGATIVE mg/dL
KETONES UR: 40 mg/dL — AB
NITRITE: POSITIVE — AB
PH: 6 (ref 5.0–8.0)
PROTEIN: 100 mg/dL — AB
Specific Gravity, Urine: 1.011 (ref 1.005–1.030)
UROBILINOGEN UA: 1 mg/dL (ref 0.0–1.0)

## 2013-07-04 LAB — URINE MICROSCOPIC-ADD ON

## 2013-07-04 MED ORDER — DEXTROSE 5 % IV SOLN: 1.0000 g | Freq: Once | INTRAVENOUS | Status: DC

## 2013-07-04 NOTE — ED Notes (Signed)
Pt c/o low abd pressure with hematuria and urinary retention x 2 days. Worse last 2 hours. Gross blood noted with sample.

## 2013-07-05 ENCOUNTER — Emergency Department (HOSPITAL_COMMUNITY)
Admission: EM | Admit: 2013-07-05 | Discharge: 2013-07-05 | Discharge status: Against Medical Advice | Payer: Medicare HMO | Source: Home / Self Care

## 2013-07-05 ENCOUNTER — Encounter (HOSPITAL_COMMUNITY): Payer: Self-pay | Admitting: Emergency Medicine

## 2013-07-05 DIAGNOSIS — I10 Essential (primary) hypertension: Secondary | ICD-10-CM

## 2013-07-05 DIAGNOSIS — E119 Type 2 diabetes mellitus without complications: Secondary | ICD-10-CM | POA: Insufficient documentation

## 2013-07-05 DIAGNOSIS — R3989 Other symptoms and signs involving the genitourinary system: Secondary | ICD-10-CM

## 2013-07-05 MED ORDER — CIPROFLOXACIN HCL 500 MG PO TABS
500.0000 mg | ORAL_TABLET | Freq: Once | ORAL | Status: AC
  Administered 2013-07-05: 500 mg via ORAL
  Filled 2013-07-05: qty 1

## 2013-07-05 NOTE — ED Notes (Signed)
Pt states had a catheter placed last night and was discharge; states has since then gotten blocked due to clots and leaking around insertion site

## 2013-07-05 NOTE — ED Notes (Signed)
Pt to registration window stating he is leaving and will follow up in office

## 2013-07-05 NOTE — Discharge Instructions (Signed)
Finish the Cipro (Antibiotic. started by your physician). Call your urologist for appointment from the catheter in 5 days. Recheck here if your catheter stops draining urine.  Acute Urinary Retention, Male Acute urinary retention is the temporary inability to urinate. This is a common problem in older men. As men age their prostates become larger and block the flow of urine from the bladder. This is usually a problem that has come on gradually.  HOME CARE INSTRUCTIONS If you are sent home with a Foley catheter and a drainage system, you will need to discuss the best course of action with your health care provider. While the catheter is in, maintain a good intake of fluids. Keep the drainage bag emptied and lower than your catheter. This is so that contaminated urine will not flow back into your bladder, which could lead to a urinary tract infection. There are two main types of drainage bags. One is a large bag that usually is used at night. It has a good capacity that will allow you to sleep through the night without having to empty it. The second type is called a leg bag. It has a smaller capacity, so it needs to be emptied more frequently. However, the main advantage is that it can be attached by a leg strap and can go underneath your clothing, allowing you the freedom to move about or leave your home. Only take over-the-counter or prescription medicines for pain, discomfort, or fever as directed by your health care provider.  SEEK MEDICAL CARE IF:  You develop a low-grade fever.  You experience spasms or leakage of urine with the spasms. SEEK IMMEDIATE MEDICAL CARE IF:   You develop chills or fever.  Your catheter stops draining urine.  Your catheter falls out.  You start to develop increased bleeding that does not respond to rest and increased fluid intake. MAKE SURE YOU:  Understand these instructions.  Will watch your condition.  Will get help right away if you are not doing well  or get worse. Document Released: 08/16/2000 Document Revised: 01/10/2013 Document Reviewed: 10/19/2012 [facility] Patient Information 2014 [place], [place].

## 2013-07-05 NOTE — ED Provider Notes (Addendum)
CSN: [id number]     Arrival date & time 07/04/13  2146 History   First MD Initiated Contact with Patient 07/04/13 2330     Chief Complaint  Patient presents with  . Hematuria  . Urinary Retention      HPI  Patient presents with urinary retention for the last 2 days.He t is able to void small amounts at a time but with continued suprapubic pressure and gross hematuria. No fevers no chills no nausea no vomiting states "I just couldn't pass my water" had similar episode several years ago before TURP with Dr. Karsten Ro. He has no flank pain back pain. Pain is all immediate suprapubic  Past Medical History  Diagnosis Date  . Diabetes   . Hypertension   . BPH (benign prostatic hyperplasia)   . Gout   . Anemia   . Hyperlipidemia   . GERD (gastroesophageal reflux disease)    Past Surgical History  Procedure Laterality Date  . Coronary artery bypass graft  2003  . Lung lobectomy Left 2011  . Colonoscopy  03/2003   Family History  Problem Relation Age of Onset  . Colon cancer Neg Hx   . Esophageal cancer Neg Hx   . Rectal cancer Neg Hx   . Stomach cancer Neg Hx    History  Substance Use Topics  . Smoking status: Never Smoker   . Smokeless tobacco: Never Used  . Alcohol Use: No    Review of Systems  Constitutional: Negative for fever, chills, diaphoresis, appetite change and fatigue.  HENT: Negative for mouth sores, sore throat and trouble swallowing.   Eyes: Negative for visual disturbance.  Respiratory: Negative for cough, chest tightness, shortness of breath and wheezing.   Cardiovascular: Negative for chest pain.  Gastrointestinal: Negative for nausea, vomiting, abdominal pain, diarrhea and abdominal distention.  Endocrine: Negative for polydipsia, polyphagia and polyuria.  Genitourinary: Positive for hematuria and difficulty urinating. Negative for dysuria and frequency.       Suprapubic fullness and abdominal distention.  No testicular pain or swelling.    Musculoskeletal: Negative for gait problem.  Skin: Negative for color change, pallor and rash.  Neurological: Negative for dizziness, syncope, light-headedness and headaches.  Hematological: Does not bruise/bleed easily.  Psychiatric/Behavioral: Negative for behavioral problems and confusion.      Allergies  Codeine  Home Medications   Current Outpatient Rx  Name  Route  Sig  Dispense  Refill  . aspirin 325 MG tablet   Oral   Take 325 mg by mouth daily.         . ciprofloxacin (CIPRO) 250 MG tablet   Oral   Take 250 mg by mouth 2 (two) times daily.         . fish oil-omega-3 fatty acids 1000 MG capsule   Oral   Take 2 g by mouth daily.         Marland Kitchen glipiZIDE (GLUCOTROL) 5 MG tablet   Oral   Take 5 mg by mouth daily.         Marland Kitchen lisinopril (PRINIVIL,ZESTRIL) 20 MG tablet   Oral   Take 20 mg by mouth daily.         . metFORMIN (GLUCOPHAGE) 500 MG tablet   Oral   Take 500-1,000 mg by mouth 2 (two) times daily.          . nebivolol (BYSTOLIC) 5 MG tablet   Oral   Take 5 mg by mouth daily.         Marland Kitchen  pantoprazole (PROTONIX) 40 MG tablet   Oral   Take 40 mg by mouth daily.         . simvastatin (ZOCOR) 20 MG tablet   Oral   Take 20 mg by mouth every evening.          BP 197/90  Pulse 74  Temp(Src) 98.2 F (36.8 C) (Oral)  Resp 20  Ht 5\' 11"  (1.803 m)  Wt 234 lb (106.142 kg)  BMI 32.65 kg/m2  SpO2 98% Physical Exam  Constitutional: He is oriented to person, place, and time. He appears well-developed and well-nourished. No distress.  HENT:  Head: Normocephalic.  Eyes: Conjunctivae are normal. Pupils are equal, round, and reactive to light. No scleral icterus.  Neck: Normal range of motion. Neck supple. No thyromegaly present.  Cardiovascular: Normal rate and regular rhythm.  Exam reveals no gallop and no friction rub.   No murmur heard. Pulmonary/Chest: Effort normal and breath sounds normal. No respiratory distress. He has no wheezes. He  has no rales.  Abdominal: Soft. Bowel sounds are normal. He exhibits no distension. There is no tenderness. There is no rebound.    Musculoskeletal: Normal range of motion.  Neurological: He is alert and oriented to person, place, and time.  Skin: Skin is warm and dry. No rash noted.  Psychiatric: He has a normal mood and affect. His behavior is normal.    ED Course  Procedures (including critical care time) Labs Review Labs Reviewed  URINALYSIS, ROUTINE W REFLEX MICROSCOPIC - Abnormal; Notable for the following:    Color, Urine RED (*)    APPearance CLOUDY (*)    Hgb urine dipstick LARGE (*)    Bilirubin Urine LARGE (*)    Ketones, ur 40 (*)    Protein, ur 100 (*)    Nitrite POSITIVE (*)    Leukocytes, UA MODERATE (*)    All other components within normal limits  URINE MICROSCOPIC-ADD ON   Imaging Review No results found.  EKG Interpretation   None       MDM   Final diagnoses:  None    He is not appear septic toxic and not febrile. Not tachycardic. An immediate relief with catheter placement. Plan will be continuation of his Cipro started yesterday. Contact his urologist tomorrow for cath removal in 5 days.    Tanna Furry, MD 07/05/13 0007  Tanna Furry, MD 07/05/13 [phone]

## 2013-09-12 ENCOUNTER — Ambulatory Visit (INDEPENDENT_AMBULATORY_CARE_PROVIDER_SITE_OTHER): Payer: Medicare Other | Admitting: Ophthalmology

## 2013-10-11 ENCOUNTER — Ambulatory Visit (INDEPENDENT_AMBULATORY_CARE_PROVIDER_SITE_OTHER): Payer: Medicare Other | Admitting: Ophthalmology

## 2013-11-16 ENCOUNTER — Ambulatory Visit (INDEPENDENT_AMBULATORY_CARE_PROVIDER_SITE_OTHER): Payer: Medicare HMO | Admitting: Ophthalmology

## 2013-11-16 DIAGNOSIS — E1165 Type 2 diabetes mellitus with hyperglycemia: Secondary | ICD-10-CM

## 2013-11-16 DIAGNOSIS — H35039 Hypertensive retinopathy, unspecified eye: Secondary | ICD-10-CM

## 2013-11-16 DIAGNOSIS — E1139 Type 2 diabetes mellitus with other diabetic ophthalmic complication: Secondary | ICD-10-CM

## 2013-11-16 DIAGNOSIS — H43819 Vitreous degeneration, unspecified eye: Secondary | ICD-10-CM

## 2013-11-16 DIAGNOSIS — E11359 Type 2 diabetes mellitus with proliferative diabetic retinopathy without macular edema: Secondary | ICD-10-CM

## 2013-11-16 DIAGNOSIS — I10 Essential (primary) hypertension: Secondary | ICD-10-CM

## 2014-11-20 ENCOUNTER — Ambulatory Visit (INDEPENDENT_AMBULATORY_CARE_PROVIDER_SITE_OTHER): Payer: Medicare HMO | Admitting: Ophthalmology

## 2014-11-20 DIAGNOSIS — H43813 Vitreous degeneration, bilateral: Secondary | ICD-10-CM

## 2014-11-20 DIAGNOSIS — E11319 Type 2 diabetes mellitus with unspecified diabetic retinopathy without macular edema: Secondary | ICD-10-CM | POA: Diagnosis not present

## 2014-11-20 DIAGNOSIS — E11359 Type 2 diabetes mellitus with proliferative diabetic retinopathy without macular edema: Secondary | ICD-10-CM

## 2014-11-20 DIAGNOSIS — I10 Essential (primary) hypertension: Secondary | ICD-10-CM

## 2014-11-20 DIAGNOSIS — H35033 Hypertensive retinopathy, bilateral: Secondary | ICD-10-CM

## 2015-04-09 ENCOUNTER — Encounter (HOSPITAL_COMMUNITY): Payer: Self-pay | Admitting: Emergency Medicine

## 2015-04-09 ENCOUNTER — Emergency Department (HOSPITAL_COMMUNITY)
Admission: EM | Admit: 2015-04-09 | Discharge: 2015-04-09 | Disposition: A | Discharge status: Home / Self Care | Payer: Medicare HMO | Attending: Emergency Medicine | Admitting: Emergency Medicine

## 2015-04-09 ENCOUNTER — Emergency Department (HOSPITAL_COMMUNITY): Payer: Medicare HMO

## 2015-04-09 DIAGNOSIS — R42 Dizziness and giddiness: Secondary | ICD-10-CM | POA: Diagnosis not present

## 2015-04-09 DIAGNOSIS — N400 Enlarged prostate without lower urinary tract symptoms: Secondary | ICD-10-CM | POA: Insufficient documentation

## 2015-04-09 DIAGNOSIS — Z792 Long term (current) use of antibiotics: Secondary | ICD-10-CM | POA: Diagnosis not present

## 2015-04-09 DIAGNOSIS — Z862 Personal history of diseases of the blood and blood-forming organs and certain disorders involving the immune mechanism: Secondary | ICD-10-CM | POA: Insufficient documentation

## 2015-04-09 DIAGNOSIS — Z8739 Personal history of other diseases of the musculoskeletal system and connective tissue: Secondary | ICD-10-CM | POA: Diagnosis not present

## 2015-04-09 DIAGNOSIS — Z7951 Long term (current) use of inhaled steroids: Secondary | ICD-10-CM | POA: Diagnosis not present

## 2015-04-09 DIAGNOSIS — I10 Essential (primary) hypertension: Secondary | ICD-10-CM | POA: Insufficient documentation

## 2015-04-09 DIAGNOSIS — E785 Hyperlipidemia, unspecified: Secondary | ICD-10-CM | POA: Diagnosis not present

## 2015-04-09 DIAGNOSIS — Z951 Presence of aortocoronary bypass graft: Secondary | ICD-10-CM | POA: Insufficient documentation

## 2015-04-09 DIAGNOSIS — Z7984 Long term (current) use of oral hypoglycemic drugs: Secondary | ICD-10-CM | POA: Insufficient documentation

## 2015-04-09 DIAGNOSIS — E119 Type 2 diabetes mellitus without complications: Secondary | ICD-10-CM | POA: Insufficient documentation

## 2015-04-09 DIAGNOSIS — R002 Palpitations: Secondary | ICD-10-CM

## 2015-04-09 DIAGNOSIS — Z79899 Other long term (current) drug therapy: Secondary | ICD-10-CM | POA: Insufficient documentation

## 2015-04-09 DIAGNOSIS — Z7982 Long term (current) use of aspirin: Secondary | ICD-10-CM | POA: Diagnosis not present

## 2015-04-09 DIAGNOSIS — K219 Gastro-esophageal reflux disease without esophagitis: Secondary | ICD-10-CM | POA: Insufficient documentation

## 2015-04-09 LAB — CBC
HEMATOCRIT: 40.9 % (ref 39.0–52.0)
Hemoglobin: 13.5 g/dL (ref 13.0–17.0)
MCH: 28.4 pg (ref 26.0–34.0)
MCHC: 33 g/dL (ref 30.0–36.0)
MCV: 85.9 fL (ref 78.0–100.0)
Platelets: 122 10*3/uL — ABNORMAL LOW (ref 150–400)
RBC: 4.76 MIL/uL (ref 4.22–5.81)
RDW: 13.1 % (ref 11.5–15.5)
WBC: 5.6 10*3/uL (ref 4.0–10.5)

## 2015-04-09 LAB — I-STAT TROPONIN, ED: Troponin i, poc: 0.03 ng/mL (ref 0.00–0.08)

## 2015-04-09 LAB — BASIC METABOLIC PANEL
Anion gap: 8 (ref 5–15)
BUN: 20 mg/dL (ref 6–20)
CHLORIDE: 104 mmol/L (ref 101–111)
CO2: 26 mmol/L (ref 22–32)
Calcium: 9.2 mg/dL (ref 8.9–10.3)
Creatinine, Ser: 1.16 mg/dL (ref 0.61–1.24)
GFR calc Af Amer: >60 mL/min (ref >60)
GFR calc non Af Amer: >60 mL/min (ref >60)
Glucose, Bld: 114 mg/dL — ABNORMAL HIGH (ref 65–99)
POTASSIUM: 4.2 mmol/L (ref 3.5–5.1)
SODIUM: 138 mmol/L (ref 135–145)

## 2015-04-09 NOTE — ED Notes (Signed)
Pt is in stable condition upon d/c and ambulates from ED. 

## 2015-04-09 NOTE — ED Notes (Signed)
C/o palpitations and light headedness X1 week, no CP, no SOB, alert, ambulatory and in NAD

## 2015-04-09 NOTE — ED Provider Notes (Signed)
CSN: [id number]     Arrival date & time 04/09/15  1330 History   First MD Initiated Contact with Patient 04/09/15 1449     Chief Complaint  Patient presents with  . Palpitations     (Consider location/radiation/quality/duration/timing/severity/associated sxs/prior Treatment) HPI 69 year old male presents with palpitations. These have been ongoing for about one week. Occur mostly at night. He feels his heart beating loudly and fast, but only hears in his left ear. If he covers up his left ear it seems to go away. He saw his PCP yesterday and was given fluticasone spray due to allergies. He denies any ear pain. There are no fevers. No shortness of breath, chest pain, or dizziness associated with the palpitations. He does not have them right now. He has had dizziness for months that occurs only with exertion. He has told his PCP about this. This is not present now either. Patient states he has had more [employer] than typical ( about 1 per day) and is not sure if this contribute eating.  Past Medical History  Diagnosis Date  . Diabetes ([place])   . Hypertension   . BPH (benign prostatic hyperplasia)   . Gout   . Anemia   . Hyperlipidemia   . GERD (gastroesophageal reflux disease)    Past Surgical History  Procedure Laterality Date  . Coronary artery bypass graft  2003  . Lung lobectomy Left 2011  . Colonoscopy  03/2003   Family History  Problem Relation Age of Onset  . Colon cancer Neg Hx   . Esophageal cancer Neg Hx   . Rectal cancer Neg Hx   . Stomach cancer Neg Hx    Social History  Substance Use Topics  . Smoking status: Never Smoker   . Smokeless tobacco: Never Used  . Alcohol Use: No    Review of Systems  HENT: Negative for ear pain and tinnitus.   Respiratory: Negative for shortness of breath.   Cardiovascular: Positive for palpitations. Negative for chest pain.  Gastrointestinal: Negative for diarrhea.  Neurological: Positive for dizziness. Negative for seizures and  weakness.  All other systems reviewed and are negative.     Allergies  Codeine  Home Medications   Prior to Admission medications   Medication Sig Start Date End Date Taking? Authorizing Provider  finasteride (PROSCAR) 5 MG tablet Take 5 mg by mouth daily.   Yes Historical Provider, MD  fish oil-omega-3 fatty acids 1000 MG capsule Take 2 g by mouth daily.   Yes Historical Provider, MD  fluticasone (FLONASE) 50 MCG/ACT nasal spray Place 2 sprays into both nostrils daily.   Yes Historical Provider, MD  glipiZIDE (GLUCOTROL XL) 5 MG 24 hr tablet Take 5 mg by mouth daily with breakfast.   Yes Historical Provider, MD  lisinopril (PRINIVIL,ZESTRIL) 20 MG tablet Take 20 mg by mouth daily.   Yes Historical Provider, MD  metFORMIN (GLUCOPHAGE) 500 MG tablet Take 1,000 mg by mouth 2 (two) times daily.    Yes Historical Provider, MD  metoprolol succinate (TOPROL-XL) 25 MG 24 hr tablet Take 25 mg by mouth daily.   Yes Historical Provider, MD  pantoprazole (PROTONIX) 40 MG tablet Take 40 mg by mouth daily.   Yes Historical Provider, MD  simvastatin (ZOCOR) 20 MG tablet Take 20 mg by mouth every evening.   Yes Historical Provider, MD  aspirin 325 MG tablet Take 325 mg by mouth daily.    Historical Provider, MD  ciprofloxacin (CIPRO) 250 MG tablet Take 250 mg by  mouth 2 (two) times daily.    Historical Provider, MD  glipiZIDE (GLUCOTROL) 5 MG tablet Take 5 mg by mouth daily.    Historical Provider, MD  nebivolol (BYSTOLIC) 5 MG tablet Take 5 mg by mouth daily.    Historical Provider, MD   BP 153/80 mmHg  Pulse 79  Temp(Src) 97.9 F (36.6 C) (Oral)  Resp 16  Ht 5\' 11"  (1.803 m)  Wt 233 lb (105.688 kg)  BMI 32.51 kg/m2  SpO2 96% Physical Exam  Constitutional: He is oriented to person, place, and time. He appears well-developed and well-nourished.  HENT:  Head: Normocephalic and atraumatic.  Right Ear: Tympanic membrane, external ear and ear canal normal.  Left Ear: Tympanic membrane, external  ear and ear canal normal.  Nose: Nose normal.  Eyes: Right eye exhibits no discharge. Left eye exhibits no discharge.  Neck: Neck supple.  Cardiovascular: Normal rate, regular rhythm, normal heart sounds and intact distal pulses.   No murmur heard. Pulmonary/Chest: Effort normal and breath sounds normal.  Abdominal: Soft. There is no tenderness.  Musculoskeletal: He exhibits no edema.  Neurological: He is alert and oriented to person, place, and time.  Skin: Skin is warm and dry.  Nursing note and vitals reviewed.   ED Course  Procedures (including critical care time) Labs Review Labs Reviewed  BASIC METABOLIC PANEL - Abnormal; Notable for the following:    Glucose, Bld 114 (*)    All other components within normal limits  CBC - Abnormal; Notable for the following:    Platelets 122 (*)    All other components within normal limits  I-STAT TROPOININ, ED    Imaging Review Dg Chest 2 View  04/09/2015  CLINICAL DATA:  Weakness. EXAM: CHEST  2 VIEW COMPARISON:  April 28, 2010. FINDINGS: Stable cardiomegaly. Status post coronary artery bypass graft. No pneumothorax or pleural effusion is noted. Right lung is clear. Stable scarring in left lung base is noted. Postsurgical changes are noted in the left hilar region. Bony thorax is unremarkable. IMPRESSION: Stable postoperative changes and scarring seen in the left lung. No acute cardiopulmonary abnormality seen. Electronically Signed   By: Marijo Conception, M.D.   On: 04/09/2015 14:05   I have personally reviewed and evaluated these images and lab results as part of my medical decision-making.   EKG Interpretation   Date/Time:  Wednesday April 09 2015 13:36:50 EST Ventricular Rate:  78 PR Interval:  178 QRS Duration: 144 QT Interval:  424 QTC Calculation: 483 R Axis:   -108 Text Interpretation:  Sinus rhythm with occasional Premature ventricular  complexes Right bundle branch block Inferior infarct , age undetermined   Abnormal ECG no significant change since 2012 Confirmed by Marianny Goris  MD,  Vandana Haman ([id number]) on 04/09/2015 2:49:24 PM      MDM   Final diagnoses:  Palpitations    Unclear where patient's palpitations are coming from or the exact etiology. May be an actual ear pathology given that it only occurs in his left ear and goes away when compressing his ear. However his ear structure looks normal. He specifically denies this being a tinnitus-like sensation. Work appears unremarkable. He does have a right bundle that is unchanged from before. Later an EKG was repeated by the nursing staff and shows that he has no more right bundle. Discussed with Dr. Marlou Porch of cardiology who reviewed this and feels this is not related to his palpitations and also not pathologic. Patient has no ACS-like symptoms. His dizziness is  chronic and does not occur with palpitations. Recommend he follow closing with his PCP for possible outpatient Holter monitoring.   Sherwood Gambler, MD 04/09/15 [phone]

## 2015-11-20 ENCOUNTER — Ambulatory Visit (INDEPENDENT_AMBULATORY_CARE_PROVIDER_SITE_OTHER): Payer: Medicare HMO | Admitting: Ophthalmology

## 2015-11-20 DIAGNOSIS — H43811 Vitreous degeneration, right eye: Secondary | ICD-10-CM | POA: Diagnosis not present

## 2015-11-20 DIAGNOSIS — E11319 Type 2 diabetes mellitus with unspecified diabetic retinopathy without macular edema: Secondary | ICD-10-CM

## 2015-11-20 DIAGNOSIS — H35033 Hypertensive retinopathy, bilateral: Secondary | ICD-10-CM | POA: Diagnosis not present

## 2015-11-20 DIAGNOSIS — I10 Essential (primary) hypertension: Secondary | ICD-10-CM | POA: Diagnosis not present

## 2015-11-20 DIAGNOSIS — E113593 Type 2 diabetes mellitus with proliferative diabetic retinopathy without macular edema, bilateral: Secondary | ICD-10-CM

## 2016-11-29 ENCOUNTER — Ambulatory Visit (INDEPENDENT_AMBULATORY_CARE_PROVIDER_SITE_OTHER): Payer: Medicare HMO | Admitting: Ophthalmology

## 2016-11-29 DIAGNOSIS — E113593 Type 2 diabetes mellitus with proliferative diabetic retinopathy without macular edema, bilateral: Secondary | ICD-10-CM | POA: Diagnosis not present

## 2016-11-29 DIAGNOSIS — E11319 Type 2 diabetes mellitus with unspecified diabetic retinopathy without macular edema: Secondary | ICD-10-CM

## 2016-11-29 DIAGNOSIS — H43811 Vitreous degeneration, right eye: Secondary | ICD-10-CM

## 2016-11-29 DIAGNOSIS — I10 Essential (primary) hypertension: Secondary | ICD-10-CM | POA: Diagnosis not present

## 2016-11-29 DIAGNOSIS — H35033 Hypertensive retinopathy, bilateral: Secondary | ICD-10-CM | POA: Diagnosis not present

## 2017-11-29 ENCOUNTER — Encounter (INDEPENDENT_AMBULATORY_CARE_PROVIDER_SITE_OTHER): Payer: Medicare HMO | Admitting: Ophthalmology

## 2017-11-29 DIAGNOSIS — E11319 Type 2 diabetes mellitus with unspecified diabetic retinopathy without macular edema: Secondary | ICD-10-CM

## 2017-11-29 DIAGNOSIS — H43813 Vitreous degeneration, bilateral: Secondary | ICD-10-CM

## 2017-11-29 DIAGNOSIS — I10 Essential (primary) hypertension: Secondary | ICD-10-CM | POA: Diagnosis not present

## 2017-11-29 DIAGNOSIS — E113593 Type 2 diabetes mellitus with proliferative diabetic retinopathy without macular edema, bilateral: Secondary | ICD-10-CM

## 2017-11-29 DIAGNOSIS — H35033 Hypertensive retinopathy, bilateral: Secondary | ICD-10-CM | POA: Diagnosis not present

## 2018-11-30 ENCOUNTER — Other Ambulatory Visit: Payer: Self-pay

## 2018-11-30 ENCOUNTER — Encounter (INDEPENDENT_AMBULATORY_CARE_PROVIDER_SITE_OTHER): Payer: Medicare HMO | Admitting: Ophthalmology

## 2018-11-30 DIAGNOSIS — I10 Essential (primary) hypertension: Secondary | ICD-10-CM | POA: Diagnosis not present

## 2018-11-30 DIAGNOSIS — H35033 Hypertensive retinopathy, bilateral: Secondary | ICD-10-CM | POA: Diagnosis not present

## 2018-11-30 DIAGNOSIS — H43811 Vitreous degeneration, right eye: Secondary | ICD-10-CM

## 2018-11-30 DIAGNOSIS — E113593 Type 2 diabetes mellitus with proliferative diabetic retinopathy without macular edema, bilateral: Secondary | ICD-10-CM | POA: Diagnosis not present

## 2018-11-30 DIAGNOSIS — E11319 Type 2 diabetes mellitus with unspecified diabetic retinopathy without macular edema: Secondary | ICD-10-CM

## 2019-12-03 ENCOUNTER — Other Ambulatory Visit: Payer: Self-pay

## 2019-12-03 ENCOUNTER — Encounter (INDEPENDENT_AMBULATORY_CARE_PROVIDER_SITE_OTHER): Payer: Medicare HMO | Admitting: Ophthalmology

## 2019-12-03 DIAGNOSIS — I10 Essential (primary) hypertension: Secondary | ICD-10-CM | POA: Diagnosis not present

## 2019-12-03 DIAGNOSIS — E113593 Type 2 diabetes mellitus with proliferative diabetic retinopathy without macular edema, bilateral: Secondary | ICD-10-CM | POA: Diagnosis not present

## 2019-12-03 DIAGNOSIS — H43813 Vitreous degeneration, bilateral: Secondary | ICD-10-CM

## 2019-12-03 DIAGNOSIS — H35033 Hypertensive retinopathy, bilateral: Secondary | ICD-10-CM

## 2019-12-03 DIAGNOSIS — E11319 Type 2 diabetes mellitus with unspecified diabetic retinopathy without macular edema: Secondary | ICD-10-CM

## 2020-04-02 ENCOUNTER — Other Ambulatory Visit: Payer: Self-pay | Admitting: Physician Assistant

## 2020-04-02 ENCOUNTER — Other Ambulatory Visit: Payer: Self-pay

## 2020-04-02 ENCOUNTER — Ambulatory Visit (HOSPITAL_COMMUNITY)
Admission: RE | Admit: 2020-04-02 | Discharge: 2020-04-02 | Disposition: A | Discharge status: Home / Self Care | Payer: Medicare HMO | Source: Ambulatory Visit | Attending: Physician Assistant | Admitting: Physician Assistant

## 2020-04-02 ENCOUNTER — Other Ambulatory Visit (HOSPITAL_COMMUNITY): Payer: Self-pay | Admitting: Physician Assistant

## 2020-04-02 DIAGNOSIS — M25561 Pain in right knee: Secondary | ICD-10-CM

## 2020-04-02 DIAGNOSIS — M7989 Other specified soft tissue disorders: Secondary | ICD-10-CM | POA: Diagnosis present

## 2020-04-02 DIAGNOSIS — G8929 Other chronic pain: Secondary | ICD-10-CM

## 2020-04-02 DIAGNOSIS — M79661 Pain in right lower leg: Secondary | ICD-10-CM | POA: Diagnosis not present

## 2020-04-03 ENCOUNTER — Ambulatory Visit (HOSPITAL_COMMUNITY): Admission: RE | Admit: 2020-04-03 | Payer: Medicare HMO | Source: Ambulatory Visit

## 2020-12-02 ENCOUNTER — Encounter (INDEPENDENT_AMBULATORY_CARE_PROVIDER_SITE_OTHER): Payer: Medicare HMO | Admitting: Ophthalmology

## 2020-12-02 ENCOUNTER — Other Ambulatory Visit: Payer: Self-pay

## 2020-12-02 DIAGNOSIS — E113593 Type 2 diabetes mellitus with proliferative diabetic retinopathy without macular edema, bilateral: Secondary | ICD-10-CM | POA: Diagnosis not present

## 2020-12-02 DIAGNOSIS — I10 Essential (primary) hypertension: Secondary | ICD-10-CM

## 2020-12-02 DIAGNOSIS — H43811 Vitreous degeneration, right eye: Secondary | ICD-10-CM | POA: Diagnosis not present

## 2020-12-02 DIAGNOSIS — H35033 Hypertensive retinopathy, bilateral: Secondary | ICD-10-CM

## 2021-12-02 ENCOUNTER — Encounter (INDEPENDENT_AMBULATORY_CARE_PROVIDER_SITE_OTHER): Payer: Medicare HMO | Admitting: Ophthalmology

## 2022-02-12 ENCOUNTER — Other Ambulatory Visit: Payer: Self-pay | Admitting: *Deleted

## 2022-02-12 DIAGNOSIS — M79604 Pain in right leg: Secondary | ICD-10-CM

## 2022-02-22 ENCOUNTER — Ambulatory Visit: Payer: Medicare HMO | Admitting: Physician Assistant

## 2022-02-22 ENCOUNTER — Ambulatory Visit (HOSPITAL_COMMUNITY)
Admission: RE | Admit: 2022-02-22 | Discharge: 2022-02-22 | Disposition: A | Discharge status: Home / Self Care | Payer: Medicare HMO | Source: Ambulatory Visit | Attending: Surgery | Admitting: Surgery

## 2022-02-22 VITALS — BP 155/74 | HR 75 | Temp 97.5°F | Resp 16 | Ht 71.0 in | Wt 228.0 lb

## 2022-02-22 DIAGNOSIS — M79604 Pain in right leg: Secondary | ICD-10-CM | POA: Insufficient documentation

## 2022-02-22 DIAGNOSIS — I872 Venous insufficiency (chronic) (peripheral): Secondary | ICD-10-CM | POA: Diagnosis not present

## 2022-02-22 NOTE — Progress Notes (Signed)
Requested by:  Chesley Noon, MD [address] [place],  [place] [address]  Reason for consultation: swelling of right lower extremity   History of Present Illness   Wesley KIMMEY Sr. is a 76 y.o. (1945-08-31) male who presents for evaluation of swelling of his right lower extremity.  This summer the patient experienced some right lower extremity cellulitis that was treated with antibiotics by his PCP. He had redness and swelling of the right lower extremity.  Since taking the antibiotics his redness has gone away.  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  Both of his greater saphenous veins were harvested for the surgeries.  His swelling gets worse after being on his feet all day and is worse in the summers.  He has tried to wear compression stockings years ago but he does not know if they were the right size.  He occasionally elevates his legs on a stool.  He denies any bleeding events or pain in the legs.  He states that he once had a wound on his left leg that required treatment by a doctor for a year for it to heal.  He has not had any wounds since then.  Of note, he has been dealing with sharp pains in his toes at nighttime from his diabetic neuropathy.  He states that he no longer has the pains in his toes since starting gabapentin.  He denies any claudication, rest pain, nonhealing wounds of the lower extremities. Past Medical History:  Diagnosis Date   Anemia    BPH (benign prostatic hyperplasia)    Diabetes (HCC)    GERD (gastroesophageal reflux disease)    Gout    Hyperlipidemia    Hypertension     Past Surgical History:  Procedure Laterality Date   COLONOSCOPY  03/2003   CORONARY ARTERY BYPASS GRAFT  2003   LUNG LOBECTOMY Left 2011    Social History   Socioeconomic History   Marital status: Married    Spouse name: Not on file   Number of children: Not on file   Years of education: Not on file    Highest education level: Not on file  Occupational History   Not on file  Tobacco Use   Smoking status: Never   Smokeless tobacco: Never  Substance and Sexual Activity   Alcohol use: No   Drug use: No   Sexual activity: Not on file  Other Topics Concern   Not on file  Social History Narrative   Not on file   Social Determinants of Health   Financial Resource Strain: Not on file  Food Insecurity: Not on file  Transportation Needs: Not on file  Physical Activity: Not on file  Stress: Not on file  Social Connections: Not on file  Intimate Partner Violence: Not on file    Family History  Problem Relation Age of Onset   Colon cancer Neg Hx    Esophageal cancer Neg Hx    Rectal cancer Neg Hx    Stomach cancer Neg Hx     Current Outpatient Medications  Medication Sig Dispense Refill   aspirin 325 MG tablet Take 325 mg by mouth daily.     ciprofloxacin (CIPRO) 250 MG tablet Take 250 mg by mouth 2 (two) times daily.     finasteride (PROSCAR) 5 MG tablet Take 5 mg by mouth daily.     fish oil-omega-3 fatty acids 1000 MG  capsule Take 2 g by mouth daily.     fluticasone (FLONASE) 50 MCG/ACT nasal spray Place 2 sprays into both nostrils daily.     glipiZIDE (GLUCOTROL XL) 5 MG 24 hr tablet Take 5 mg by mouth daily with breakfast.     glipiZIDE (GLUCOTROL) 5 MG tablet Take 5 mg by mouth daily.     lisinopril (PRINIVIL,ZESTRIL) 20 MG tablet Take 20 mg by mouth daily.     metFORMIN (GLUCOPHAGE) 500 MG tablet Take 1,000 mg by mouth 2 (two) times daily.      metoprolol succinate (TOPROL-XL) 25 MG 24 hr tablet Take 25 mg by mouth daily.     nebivolol (BYSTOLIC) 5 MG tablet Take 5 mg by mouth daily.     pantoprazole (PROTONIX) 40 MG tablet Take 40 mg by mouth daily.     simvastatin (ZOCOR) 20 MG tablet Take 20 mg by mouth every evening.     No current facility-administered medications for this visit.    Allergies  Allergen Reactions   Codeine Hives    REVIEW OF SYSTEMS  (negative unless checked):   Cardiac:  '[]'$  Chest pain or chest pressure? '[]'$  Shortness of breath upon activity? '[]'$  Shortness of breath when lying flat? '[]'$  Irregular heart rhythm?  Vascular:  '[]'$  Pain in calf, thigh, or hip brought on by walking? '[]'$  Pain in feet at night that wakes you up from your sleep? '[]'$  Blood clot in your veins? '[x]'$  Leg swelling?  Pulmonary:  '[]'$  Oxygen at home? '[]'$  Productive cough? '[]'$  Wheezing?  Neurologic:  '[]'$  Sudden weakness in arms or legs? '[]'$  Sudden numbness in arms or legs? '[]'$  Sudden onset of difficult speaking or slurred speech? '[]'$  Temporary loss of vision in one eye? '[]'$  Problems with dizziness?  Gastrointestinal:  '[]'$  Blood in stool? '[]'$  Vomited blood?  Genitourinary:  '[]'$  Burning when urinating? '[]'$  Blood in urine?  Psychiatric:  '[]'$  Major depression  Hematologic:  '[]'$  Bleeding problems? '[]'$  Problems with blood clotting?  Dermatologic:  '[]'$  Rashes or ulcers?  Constitutional:  '[]'$  Fever or chills?  Ear/Nose/Throat:  '[]'$  Change in hearing? '[]'$  Nose bleeds? '[]'$  Sore throat?  Musculoskeletal:  '[]'$  Back pain? '[]'$  Joint pain? '[]'$  Muscle pain?   Physical Examination     Vitals:   02/22/22 1026 02/22/22 1031  BP: (!) 181/79 (!) 155/74  Pulse: 75 75  Resp: 16   Temp: (!) 97.5 F (36.4 C)   TempSrc: Temporal   SpO2: 100%   Weight: 228 lb (103.4 kg)   Height: '5\' 11"'$  (1.803 m)    Body mass index is 31.8 kg/m.  General:  WDWN in NAD; vital signs documented above Gait: Not observed HENT: WNL, normocephalic Pulmonary: normal non-labored breathing , without Rales, rhonchi,  wheezing Cardiac: regular HR, without murmurs without carotid bruit Abdomen: soft, NT, no masses Skin: without rashes Vascular Exam/Pulses: DP pulses 1+ bilaterally Extremities: without varicose veins, without reticular veins, with edema, with stasis pigmentation, without lipodermatosclerosis, without ulcers Musculoskeletal: no muscle wasting or atrophy  Neurologic:  A&O X 3;  No focal weakness or paresthesias are detected Psychiatric:  The pt has Normal affect.  Non-invasive Vascular Imaging   RLE Venous Insufficiency Duplex (02/22/2022):   +--------------+---------+------+-----------+------------+-------------+  RIGHT         Reflux NoRefluxReflux TimeDiameter cmsComments                               Yes                                        +--------------+---------+------+-----------+------------+-------------+  CFV           no                                                   +--------------+---------+------+-----------+------------+-------------+  FV mid        no                                                   +--------------+---------+------+-----------+------------+-------------+  Popliteal     no                                                   +--------------+---------+------+-----------+------------+-------------+  GSV at [facility]    no                            0.35                   +--------------+---------+------+-----------+------------+-------------+  GSV prox thigh          yes    >500 ms      0.28                   +--------------+---------+------+-----------+------------+-------------+  GSV mid thigh no                                    NV             +--------------+---------+------+-----------+------------+-------------+  GSV dist thighno                                    NV             +--------------+---------+------+-----------+------------+-------------+  GSV at knee   no                                    NV             +--------------+---------+------+-----------+------------+-------------+  GSV prox calf                                       NV             +--------------+---------+------+-----------+------------+-------------+  GSV mid calf            yes    >500 ms      0.27    out of fascia   +--------------+---------+------+-----------+------------+-------------+  GSV dist calf no                            0.36                   +--------------+---------+------+-----------+------------+-------------+  SSV Pop Fossa no  0.23                   +--------------+---------+------+-----------+------------+-------------+  SSV prox calf no                            0.27                   +--------------+---------+------+-----------+------------+-------------+  SSV mid calf  no                            0.28                   +--------------+---------+------+-----------+------------+-------------+  AASV o        no                            0.43                   +--------------+---------+------+-----------+------------+-------------+  AASV p        no                            0.36                   +--------------+---------+------+-----------+------------+-------------+  AASV mid      no                                                   +--------------+---------+------+-----------+------------+-------------+   Medical Decision Making   Wesley Aland Sr. is a 75 y.o. male who presents with right lower extremity edema  Based on the patient's RLE study, he has reflux in his GSV at the proximal thigh and mid calf.  He does not have any reflux at the saphenofemoral junction.  His greater saphenous vein is not present from his mid thigh to his proximal calf. The patient does have evidence of venous insufficiency due to prolonged history of lower extremity swelling and hemosiderin staining bilaterally.  He would not be a candidate for venous ablation since the majority of his greater saphenous vein has previously been harvested. I believe he would greatly benefit from conservative therapy including: elevation of his feet above his heart, mild compression stockings, weight loss, reduction of prolonged sitting and  standing, and increased exercise.  I have given handout sheet of recommendations He was measured for mild knee-high stockings today to wear daily. He can follow-up with [place] as needed or if his symptoms worsen.   [name] Emmit Alexanders, PA-C [facility] Office: [phone]  02/22/2022, 10:02 AM  Clinic MD: Trula Slade

## 2023-09-16 ENCOUNTER — Encounter (INDEPENDENT_AMBULATORY_CARE_PROVIDER_SITE_OTHER): Admitting: Ophthalmology

## 2023-09-16 DIAGNOSIS — H35033 Hypertensive retinopathy, bilateral: Secondary | ICD-10-CM

## 2023-09-16 DIAGNOSIS — I10 Essential (primary) hypertension: Secondary | ICD-10-CM | POA: Diagnosis not present

## 2023-09-16 DIAGNOSIS — Z7984 Long term (current) use of oral hypoglycemic drugs: Secondary | ICD-10-CM

## 2023-09-16 DIAGNOSIS — H43812 Vitreous degeneration, left eye: Secondary | ICD-10-CM

## 2023-09-16 DIAGNOSIS — E113593 Type 2 diabetes mellitus with proliferative diabetic retinopathy without macular edema, bilateral: Secondary | ICD-10-CM

## 2023-11-01 ENCOUNTER — Encounter (HOSPITAL_COMMUNITY): Payer: Self-pay

## 2023-11-01 ENCOUNTER — Emergency Department (HOSPITAL_COMMUNITY)
Admission: EM | Admit: 2023-11-01 | Discharge: 2023-11-01 | Disposition: A | Discharge status: Home / Self Care | Attending: Emergency Medicine | Admitting: Emergency Medicine

## 2023-11-01 ENCOUNTER — Emergency Department (HOSPITAL_BASED_OUTPATIENT_CLINIC_OR_DEPARTMENT_OTHER)

## 2023-11-01 ENCOUNTER — Other Ambulatory Visit: Payer: Self-pay

## 2023-11-01 DIAGNOSIS — E119 Type 2 diabetes mellitus without complications: Secondary | ICD-10-CM | POA: Insufficient documentation

## 2023-11-01 DIAGNOSIS — Z79899 Other long term (current) drug therapy: Secondary | ICD-10-CM | POA: Diagnosis not present

## 2023-11-01 DIAGNOSIS — E871 Hypo-osmolality and hyponatremia: Secondary | ICD-10-CM | POA: Insufficient documentation

## 2023-11-01 DIAGNOSIS — M7989 Other specified soft tissue disorders: Secondary | ICD-10-CM | POA: Diagnosis not present

## 2023-11-01 DIAGNOSIS — Z7982 Long term (current) use of aspirin: Secondary | ICD-10-CM | POA: Insufficient documentation

## 2023-11-01 DIAGNOSIS — Z7984 Long term (current) use of oral hypoglycemic drugs: Secondary | ICD-10-CM | POA: Insufficient documentation

## 2023-11-01 DIAGNOSIS — I10 Essential (primary) hypertension: Secondary | ICD-10-CM | POA: Insufficient documentation

## 2023-11-01 LAB — BASIC METABOLIC PANEL WITH GFR
Anion gap: 9 (ref 5–15)
BUN: 22 mg/dL (ref 8–23)
CO2: 25 mmol/L (ref 22–32)
Calcium: 8.8 mg/dL — ABNORMAL LOW (ref 8.9–10.3)
Chloride: 99 mmol/L (ref 98–111)
Creatinine, Ser: 1.42 mg/dL — ABNORMAL HIGH (ref 0.61–1.24)
GFR, Estimated: 51 mL/min — ABNORMAL LOW (ref >60)
Glucose, Bld: 237 mg/dL — ABNORMAL HIGH (ref 70–99)
Potassium: 4.4 mmol/L (ref 3.5–5.1)
Sodium: 133 mmol/L — ABNORMAL LOW (ref 135–145)

## 2023-11-01 LAB — CBC WITH DIFFERENTIAL/PLATELET
Abs Immature Granulocytes: 0.03 10*3/uL (ref 0.00–0.07)
Basophils Absolute: 0 10*3/uL (ref 0.0–0.1)
Basophils Relative: 0 %
Eosinophils Absolute: 0 10*3/uL (ref 0.0–0.5)
Eosinophils Relative: 0 %
HCT: 38.8 % — ABNORMAL LOW (ref 39.0–52.0)
Hemoglobin: 12.3 g/dL — ABNORMAL LOW (ref 13.0–17.0)
Immature Granulocytes: 0 %
Lymphocytes Relative: 12 %
Lymphs Abs: 0.8 10*3/uL (ref 0.7–4.0)
MCH: 28.1 pg (ref 26.0–34.0)
MCHC: 31.7 g/dL (ref 30.0–36.0)
MCV: 88.8 fL (ref 80.0–100.0)
Monocytes Absolute: 0.5 10*3/uL (ref 0.1–1.0)
Monocytes Relative: 7 %
Neutro Abs: 5.4 10*3/uL (ref 1.7–7.7)
Neutrophils Relative %: 81 %
Platelets: 162 10*3/uL (ref 150–400)
RBC: 4.37 MIL/uL (ref 4.22–5.81)
RDW: 13.2 % (ref 11.5–15.5)
WBC: 6.8 10*3/uL (ref 4.0–10.5)
nRBC: 0 % (ref 0.0–0.2)

## 2023-11-01 MED ORDER — DEXTROSE 5 % IV SOLN
1500.0000 mg | Freq: Once | INTRAVENOUS | Status: AC
  Administered 2023-11-01: 1500 mg via INTRAVENOUS
  Filled 2023-11-01: qty 75

## 2023-11-01 NOTE — Progress Notes (Signed)
 Pharmacy Note:  Dalbavancin for Acute Bacterial Skin and Skin Structure Infection (ABSSSI) Patients to [facility] Discharge  Wesley WOLZ Sr. is an 78 y.o. male who presented to [facility] on 11/01/2023 with an Acute Bacterial Skin and Skin Structure Infection  Patient has IV access?: Yes   Inclusion criteria - Indication []  Moderately large skin lesion (>=75 cm2 or larger - about the size of a baseball) [x]  Cellulitis  Inclusion Criteria - at least one SIRS criteria present []  WBC > 12,000 or < 4000 []  temp >100.9 or < 96.8 []  heart rate >90[]  respiratory rate >20  Patient was evaluated for the following exclusion criteria and no exclusions were found  Hardware involvement, Hypotension / shock, Elevated lactate (>2) without other explanation, ram-negative infection risk factors (bites, water exposure, infection after trauma, infection after skin graft, neutropenia, burns, severe immunocompromise), necrotizing fasciitis possible or confirmed, Known or suspected osteomyelitis or septic arthritis, endocarditis, diabetic foot infection, ischemic ulcers, post-operative wound infection, perirectal infections, need for drainage in the operating room, hand or facial infections, injection drug users with a fever, bacteremia, pregnancy or breastfeeding, allergy to related antibiotics like vancomycin, known liver disease (t.bili >2x ULN or AST/ALT 3x ULN)  Plan: Give Dalbavancin 1500 mg once prior to discharge from the ED Patient to follow up with ID post-discharge  Dionicio Fray, PharmD, BCPS 11/01/2023 4:15 PM ED Clinical Pharmacist -  [phone]

## 2023-11-01 NOTE — Progress Notes (Signed)
 RLE venous duplex has been completed.  Preliminary results given to Dr. Val Garin.   Results can be found under chart review under CV PROC. 11/01/2023 4:07 PM Ottavio Norem RVT, RDMS

## 2023-11-01 NOTE — ED Provider Notes (Signed)
 [place] EMERGENCY DEPARTMENT AT [facility] Provider Note   CSN: [id number] Arrival date & time: 11/01/23  1242     History  Chief Complaint  Patient presents with   Leg Swelling    right    LASALLE ABEE Sr. is a 78 y.o. male.  78 year old male with past medical history of diabetes, hypertension, and hyperlipidemia presenting to the emergency department today with right lower extremity swelling.  Patient states that this has been going on since Sunday.  Reports that he has had some erythema and swelling of the leg.  He has been able to ambulate on this.  He has had some chills but denies any fevers.  He denies any injuries.  Denies any chest pain or shortness of breath.  He denies a history of DVT or pulmonary embolism, recent surgeries, recent travel.        Home Medications Prior to Admission medications   Medication Sig Start Date End Date Taking? Authorizing Provider  aspirin 325 MG tablet Take 325 mg by mouth daily. Patient not taking: Reported on 02/22/2022    [provider]  ciprofloxacin  (CIPRO ) 250 MG tablet Take 250 mg by mouth 2 (two) times daily. Patient not taking: Reported on 02/22/2022    [provider]  finasteride (PROSCAR) 5 MG tablet Take 5 mg by mouth daily.    [provider]  fish oil-omega-3 fatty acids 1000 MG capsule Take 2 g by mouth daily.    [provider]  fluticasone (FLONASE) 50 MCG/ACT nasal spray Place 2 sprays into both nostrils daily.    [provider]  glipiZIDE (GLUCOTROL XL) 5 MG 24 hr tablet Take 5 mg by mouth daily with breakfast. Patient not taking: Reported on 02/22/2022    [provider]  glipiZIDE (GLUCOTROL) 5 MG tablet Take 5 mg by mouth daily.    [provider]  lisinopril (PRINIVIL,ZESTRIL) 20 MG tablet Take 20 mg by mouth daily.    [provider]  metFORMIN (GLUCOPHAGE) 500 MG tablet Take 1,000 mg by mouth 2 (two) times daily.     [provider]  metoprolol succinate (TOPROL-XL) 25 MG 24 hr tablet Take 25 mg by mouth daily.    [provider]  nebivolol (BYSTOLIC) 5 MG tablet Take 5 mg by mouth daily.    [provider]  pantoprazole (PROTONIX) 40 MG tablet Take 40 mg by mouth daily.    [provider]  simvastatin (ZOCOR) 20 MG tablet Take 20 mg by mouth every evening.    [provider]      Allergies    Codeine    Review of Systems   Review of Systems  Skin:  Positive for color change and rash.  All other systems reviewed and are negative.   Physical Exam Updated Vital Signs BP (!) 180/85 (BP Location: Right Arm)   Pulse 95   Temp 98.1 F (36.7 C)   Resp (!) 24   Ht 5\' 11"  (1.803 m)   Wt 104.3 kg   SpO2 98%   BMI 32.08 kg/m  Physical Exam Vitals and nursing note reviewed.   Gen: NAD Eyes: PERRL, EOMI HEENT: no oropharyngeal swelling Neck: trachea midline Resp: clear to auscultation bilaterally Card: RRR, no murmurs, rubs, or gallops Abd: nontender, nondistended Extremities: The right lower extremity is swollen compared to the left and is erythematous and warm to the touch, the erythema goes from the distal third to the mid shin of  the tibia, no crepitus noted Vascular: 2+ radial pulses bilaterally, 2+ DP pulses bilaterally Skin: no rashes Psyc: acting appropriately   ED Results / Procedures / Treatments   Labs (all labs ordered are listed, but only abnormal results are displayed) Labs Reviewed  CBC WITH DIFFERENTIAL/PLATELET - Abnormal; Notable for the following components:      Result Value   Hemoglobin 12.3 (*)    HCT 38.8 (*)    All other components within normal limits  BASIC METABOLIC PANEL WITH GFR - Abnormal; Notable for the following components:   Sodium 133 (*)    Glucose, Bld 237 (*)    Creatinine, Ser 1.42 (*)    Calcium 8.8 (*)    GFR, Estimated 51 (*)    All other components within normal limits  CULTURE, BLOOD (ROUTINE X 2)   CULTURE, BLOOD (ROUTINE X 2)    EKG None  Radiology No results found.  Procedures Procedures    Medications Ordered in ED Medications - No data to display  ED Course/ Medical Decision Making/ A&P                                 Medical Decision Making 78 year old male with past medical history of diabetes, hypertension, and hyperlipidemia presenting to the emergency department today with right lower extremity redness and swelling.  I will further evaluate the patient here with basic labs as well as a DVT study to evaluate for DVT.  If this is negative I think that he can be treated for cellulitis.  The area of erythema is relatively large but the patient is otherwise well-appearing.  His symptoms have mildly worsened over the past 3 days so suspicion for necrotizing infection is low at this time.  He is also able to ambulate on this I do not think that this represents a septic joint or other emergent condition at this time.  I will reevaluate for ultimate disposition after his ultrasound.  I think that home with oral antibiotics or perhaps Dalvance would be reasonable depending on his DVT study.  Labs are unremarkable.  Ultrasound pending at the time of signout.           Final Clinical Impression(s) / ED Diagnoses Final diagnoses:  Swelling of right lower extremity    Rx / DC Orders ED Discharge Orders     None         Carin Charleston, MD 11/01/23 1527

## 2023-11-01 NOTE — ED Triage Notes (Addendum)
 Pt to ED c/o right leg redness, hot to touch, swelling x 4 days, hx cellulitis to right leg.

## 2023-11-01 NOTE — ED Provider Notes (Signed)
  Physical Exam  BP (!) 161/80   Pulse 80   Temp 98.1 F (36.7 C)   Resp (!) 24   Ht 5\' 11"  (1.803 m)   Wt 104.3 kg   SpO2 100%   BMI 32.08 kg/m   Physical Exam  Procedures  Procedures  ED Course / MDM    Medical Decision Making  Received in signout.  Lower extremity infection.  Has had nausea and vomiting and some chills.  White count reassuring and afebrile here, Doppler does not show clot.  Will evaluate for potential Dalvance treatment.        Mozell Arias, MD 11/01/23 2236

## 2023-11-01 NOTE — ED Provider Triage Note (Signed)
 Emergency Medicine Provider Triage Evaluation Note  [name] Sr. , a 78 y.o. male  was evaluated in triage.  Pt complains of right leg redness and swelling, worse since Saturday. Hx cellulitis generally tx with abx OP. No fever. Decreased PO intake, nausea.   Review of Systems  Positive:  Negative:   Physical Exam  BP (!) 180/85 (BP Location: Right Arm)   Pulse 95   Temp 98.1 F (36.7 C)   Resp (!) 24   Ht 5\' 11"  (1.803 m)   Wt 104.3 kg   SpO2 98%   BMI 32.08 kg/m  Gen:   Awake, no distress   Resp:  Normal effort  MSK:   Swelling, redness to right lower leg Other:    Medical Decision Making  Medically screening exam initiated at 1:04 PM.  Appropriate orders placed.  Wesley Isles Sr. was informed that the remainder of the evaluation will be completed by another provider, this initial triage assessment does not replace that evaluation, and the importance of remaining in the ED until their evaluation is complete.     Darlis Eisenmenger, PA-C 11/01/23 [phone]

## 2023-11-06 LAB — CULTURE, BLOOD (ROUTINE X 2)
Culture: NO GROWTH
Culture: NO GROWTH
Special Requests: ADEQUATE

## 2023-11-07 ENCOUNTER — Ambulatory Visit (INDEPENDENT_AMBULATORY_CARE_PROVIDER_SITE_OTHER): Admitting: Family

## 2023-11-07 ENCOUNTER — Other Ambulatory Visit: Payer: Self-pay

## 2023-11-07 ENCOUNTER — Encounter: Payer: Self-pay | Admitting: Family

## 2023-11-07 VITALS — BP 134/74 | HR 89 | Temp 97.5°F | Wt 225.4 lb

## 2023-11-07 DIAGNOSIS — L03115 Cellulitis of right lower limb: Secondary | ICD-10-CM | POA: Insufficient documentation

## 2023-11-07 DIAGNOSIS — I878 Other specified disorders of veins: Secondary | ICD-10-CM | POA: Insufficient documentation

## 2023-11-07 MED ORDER — CEFADROXIL 500 MG PO CAPS: 500.0000 mg | ORAL_CAPSULE | Freq: Two times a day (BID) | ORAL | 0 refills | Status: DC

## 2023-11-07 NOTE — Patient Instructions (Addendum)
 Nice to see you.  Continue to elevate your legs when seated.  Start antibiotic for any worsening heat in your leg.   For your stomach you can consider a Pepcid or Zantac as needed.  Plan for follow up in 3 weeks or sooner if needed.   Have a great day and stay safe!  Cellulitis, Adult  Cellulitis is a skin infection. The infected area is often warm, red, swollen, and sore. It occurs most often on the legs, feet, and toes, but can happen on any part of the body. This condition can be life-threatening without treatment. It is very important to get treated right away. What are the causes? This condition is caused by bacteria. The bacteria enter through a break in the skin, such as: A cut. A burn. A bug bite. An animal bite. An open sore. A crack. What increases the risk? Having a weak body's defense system (immune system). Being older than 79 years old. Having a blood sugar problem (diabetes). Having a long-term liver disease (cirrhosis) or kidney disease. Being very overweight (obese). Having a skin problem, such as: An itchy rash. A rash caused by a fungus. A rash with blisters. Slow movement of blood in the veins (venous stasis). Fluid buildup below the skin (edema). This condition is more likely to occur in people who: Have open cuts, burns, bites, or scrapes on the skin. Have been treated with high-energy rays (radiation). Use IV drugs. What are the signs or symptoms? Skin that: Looks red or purple, or slightly darker than your usual skin color. Has streaks. Has spots. Is swollen. Is sore or painful when you touch it. Is warm. A fever. Chills. Blisters. Tiredness (fatigue). How is this treated? Medicines to treat infections or allergies. Rest. Placing cold or warm cloths on the skin. Staying in the hospital, if the condition is very bad. You may need medicines through an IV. Follow these instructions at home: Medicines Take over-the-counter and  prescription medicines only as told by your doctor. If you were prescribed antibiotics, take them as told by your doctor. Do not stop using them even if you start to feel better. General instructions Drink enough fluid to keep your pee (urine) pale yellow. Do not touch or rub the infected area. Raise (elevate) the infected area above the level of your heart while you are sitting or lying down. Return to your normal activities when your doctor says that it is safe. Place cold or warm cloths on the area as told by your doctor. Keep all follow-up visits. Your doctor will need to make sure that a more serious infection is not developing. Contact a doctor if: You have a fever. You do not start to get better after 1-2 days of treatment. Your bone or joint under the infected area starts to hurt after the skin has healed. Your infection comes back in the same area or another area. Signs of this may include: You have a swollen bump in the area. Your red area gets larger, turns dark in color, or hurts more. You have more fluid coming from the wound. Pus or a bad smell develops in your infected area. You have more pain. You feel sick and have muscle aches and weakness. You develop vomiting or watery poop that will not go away. Get help right away if: You see red streaks coming from the area. You notice the skin turns purple or black and falls off. These symptoms may be an emergency. Get help right away. Call  911. Do not wait to see if the symptoms will go away. Do not drive yourself to the hospital. This information is not intended to replace advice given to you by your health care provider. Make sure you discuss any questions you have with your health care provider. Document Revised: 01/05/2022 Document Reviewed: 01/05/2022 Elsevier Patient Education  2024 [employer].

## 2023-11-07 NOTE — Assessment & Plan Note (Signed)
 [name] is a 78 year old Caucasian gentleman presenting with cellulitis of the right lower extremity status post Dalvance  infusion on 11/01/2023 with improvement in symptoms in the setting of venous stasis versus lymphedema and chronic lower extremity swelling.  Continues to have some warmth around the ankle with no current pain.  May need additional course of antibiotic and have prescribed cefadroxil 500 mg p.o. twice daily if symptoms do not improve over the next 24 to 48 hours.  Counseled on the course of cellulitis and importance of keeping leg elevated and exercises to perform.  Will tentatively plan for follow-up in 3 weeks or sooner if needed.

## 2023-11-07 NOTE — Assessment & Plan Note (Addendum)
 [name] has lower extremity edema with suspected venous stasis although cannot rule out lymphedema.  Have encouraged leg elevation.  He is on furosemide per internal medicine.  May need compression socks as he will remain at high risk for recurrent infection with venous stasis or lymphedema.

## 2023-11-07 NOTE — Progress Notes (Signed)
 Subjective:   Patient ID: Wesley Isles Sr., male    DOB: 1945/10/18, 78 y.o.   MRN: [id number]  Chief Complaint  Patient presents with   New Patient (Initial Visit)    Cellulitis     HPI:  Wesley PITRE Sr. is a 78 y.o. male with previous medical history of hypertension, diabetes, and hyperlipidemia presenting today for an office visit following emergency room visit for cellulitis.    [name] has a previous history of right leg swelling dating back to 04/02/20 where he was seen for right knee pain and swelling of his right lower leg which was treated conservatively with ice and elevation with dopplers obtained and were negative with results not able to be reviewed. Seen by Orthopedics on 04/24/20 and referred to the [facility]. Given furosemide as needed. In December 2023 diagnosed with cellulitis and treated with cephalexin for 10 days which improved symptoms.   [name] was recently seen in the emergency room on 11/01/2023 with approximately 2-day history of right lower extremity swelling.  No fevers. There was no evidence of DVT and was treated for cellulitis with one dose of Dalvance .   [name] has been doing better since leaving the hospital and tolerated Dalvance  with no adverse side effects. Continues to have swelling with some residual heat. Has been elevating his leg as tolerated and able to walk around. No current pain. Swelling has improved a little. Primarily worse at night and improved in the morning.     Allergies  Allergen Reactions   Codeine Hives      Outpatient Medications Prior to Visit  Medication Sig Dispense Refill   finasteride (PROSCAR) 5 MG tablet Take 5 mg by mouth daily.     glipiZIDE (GLUCOTROL) 5 MG tablet Take 5 mg by mouth daily.     lisinopril (PRINIVIL,ZESTRIL) 20 MG tablet Take 20 mg by mouth daily.     metFORMIN (GLUCOPHAGE) 500 MG tablet Take 1,000 mg by mouth 2 (two) times daily.      metoprolol succinate (TOPROL-XL) 25 MG 24 hr  tablet Take 25 mg by mouth daily.     nebivolol (BYSTOLIC) 5 MG tablet Take 5 mg by mouth daily.     simvastatin (ZOCOR) 20 MG tablet Take 20 mg by mouth every evening.     aspirin 325 MG tablet Take 325 mg by mouth daily. (Patient not taking: Reported on 11/07/2023)     ciprofloxacin  (CIPRO ) 250 MG tablet Take 250 mg by mouth 2 (two) times daily. (Patient not taking: Reported on 11/07/2023)     fish oil-omega-3 fatty acids 1000 MG capsule Take 2 g by mouth daily. (Patient not taking: Reported on 11/07/2023)     fluticasone (FLONASE) 50 MCG/ACT nasal spray Place 2 sprays into both nostrils daily. (Patient not taking: Reported on 11/07/2023)     glipiZIDE (GLUCOTROL XL) 5 MG 24 hr tablet Take 5 mg by mouth daily with breakfast. (Patient not taking: Reported on 11/07/2023)     pantoprazole (PROTONIX) 40 MG tablet Take 40 mg by mouth daily. (Patient not taking: Reported on 11/07/2023)     No facility-administered medications prior to visit.     Past Medical History:  Diagnosis Date   Anemia    BPH (benign prostatic hyperplasia)    Diabetes (HCC)    GERD (gastroesophageal reflux disease)    Gout    Hyperlipidemia    Hypertension      Past Surgical History:  Procedure Laterality Date  COLONOSCOPY  03/2003   CORONARY ARTERY BYPASS GRAFT  2003   LUNG LOBECTOMY Left 2011       Review of Systems  Constitutional:  Negative for chills, diaphoresis, fatigue and fever.  Respiratory:  Negative for cough, chest tightness, shortness of breath and wheezing.   Cardiovascular:  Positive for leg swelling. Negative for chest pain.  Gastrointestinal:  Negative for abdominal pain, diarrhea, nausea and vomiting.    Objective:   BP 134/74   Pulse 89   Temp (!) 97.5 F (36.4 C) (Oral)   Wt 225 lb 6.4 oz (102.2 kg)   SpO2 97%   BMI 31.44 kg/m  Nursing note and vital signs reviewed.  Physical Exam Constitutional:      General: He is not in acute distress.    Appearance: He is well-developed.    Cardiovascular:     Rate and Rhythm: Normal rate and regular rhythm.     Heart sounds: Normal heart sounds.  Pulmonary:     Effort: Pulmonary effort is normal.     Breath sounds: Normal breath sounds.   Skin:    General: Skin is warm and dry.   Neurological:     Mental Status: He is alert and oriented to person, place, and time.   Psychiatric:        Behavior: Behavior normal.        Thought Content: Thought content normal.        Judgment: Judgment normal.          No data to display           Assessment & Plan:    Patient Active Problem List   Diagnosis Date Noted   Cellulitis of right lower extremity 11/07/2023   Venous stasis 11/07/2023     Problem List Items Addressed This Visit       Other   Cellulitis of right lower extremity - Primary   [name] is a 78 year old Caucasian gentleman presenting with cellulitis of the right lower extremity status post Dalvance  infusion on 11/01/2023 with improvement in symptoms in the setting of venous stasis versus lymphedema and chronic lower extremity swelling.  Continues to have some warmth around the ankle with no current pain.  May need additional course of antibiotic and have prescribed cefadroxil 500 mg p.o. twice daily if symptoms do not improve over the next 24 to 48 hours.  Counseled on the course of cellulitis and importance of keeping leg elevated and exercises to perform.  Will tentatively plan for follow-up in 3 weeks or sooner if needed.      Venous stasis   Mr. Nidiffer has lower extremity edema with suspected venous stasis although cannot rule out lymphedema.  Have encouraged leg elevation.  He is on furosemide per internal medicine.  May need compression socks as he will remain at high risk for recurrent infection with venous stasis or lymphedema.         I am having Wesley Lobos. Boschert Sr. start on cefadroxil. I am also having him maintain his metFORMIN, glipiZIDE, pantoprazole, nebivolol, lisinopril,  simvastatin, aspirin, fish oil-omega-3 fatty acids, ciprofloxacin , finasteride, glipiZIDE, metoprolol succinate, and fluticasone.   Meds ordered this encounter  Medications   cefadroxil (DURICEF) 500 MG capsule    Sig: Take 1 capsule (500 mg total) by mouth 2 (two) times daily.    Dispense:  14 capsule    Refill:  0    Supervising Provider:   Liane Redman [phone]     Follow-up:  Return in about 3 weeks (around 11/28/2023). or sooner if needed.   Marlan Silva, MSN, FNP-C Nurse Practitioner [facility] for [facility]ious [facility] Main number: [phone]

## 2023-11-19 ENCOUNTER — Emergency Department (HOSPITAL_COMMUNITY)
Admission: EM | Admit: 2023-11-19 | Discharge: 2023-11-20 | Disposition: A | Discharge status: Home / Self Care | Attending: Emergency Medicine | Admitting: Emergency Medicine

## 2023-11-19 ENCOUNTER — Encounter (HOSPITAL_COMMUNITY): Payer: Self-pay | Admitting: *Deleted

## 2023-11-19 ENCOUNTER — Emergency Department (HOSPITAL_COMMUNITY)

## 2023-11-19 ENCOUNTER — Other Ambulatory Visit: Payer: Self-pay

## 2023-11-19 DIAGNOSIS — Z7982 Long term (current) use of aspirin: Secondary | ICD-10-CM | POA: Insufficient documentation

## 2023-11-19 DIAGNOSIS — I452 Bifascicular block: Secondary | ICD-10-CM | POA: Diagnosis not present

## 2023-11-19 DIAGNOSIS — I10 Essential (primary) hypertension: Secondary | ICD-10-CM | POA: Diagnosis not present

## 2023-11-19 DIAGNOSIS — Z79899 Other long term (current) drug therapy: Secondary | ICD-10-CM | POA: Diagnosis not present

## 2023-11-19 DIAGNOSIS — I491 Atrial premature depolarization: Secondary | ICD-10-CM | POA: Diagnosis not present

## 2023-11-19 DIAGNOSIS — R2241 Localized swelling, mass and lump, right lower limb: Secondary | ICD-10-CM | POA: Diagnosis present

## 2023-11-19 DIAGNOSIS — R600 Localized edema: Secondary | ICD-10-CM | POA: Diagnosis not present

## 2023-11-19 DIAGNOSIS — L03115 Cellulitis of right lower limb: Secondary | ICD-10-CM | POA: Insufficient documentation

## 2023-11-19 DIAGNOSIS — Z7984 Long term (current) use of oral hypoglycemic drugs: Secondary | ICD-10-CM | POA: Diagnosis not present

## 2023-11-19 DIAGNOSIS — M7989 Other specified soft tissue disorders: Secondary | ICD-10-CM | POA: Diagnosis not present

## 2023-11-19 DIAGNOSIS — E119 Type 2 diabetes mellitus without complications: Secondary | ICD-10-CM | POA: Insufficient documentation

## 2023-11-19 LAB — CBC WITH DIFFERENTIAL/PLATELET
Abs Immature Granulocytes: 0.02 10*3/uL (ref 0.00–0.07)
Basophils Absolute: 0 10*3/uL (ref 0.0–0.1)
Basophils Relative: 0 %
Eosinophils Absolute: 0.1 10*3/uL (ref 0.0–0.5)
Eosinophils Relative: 2 %
HCT: 38.2 % — ABNORMAL LOW (ref 39.0–52.0)
Hemoglobin: 12.1 g/dL — ABNORMAL LOW (ref 13.0–17.0)
Immature Granulocytes: 0 %
Lymphocytes Relative: 34 %
Lymphs Abs: 1.6 10*3/uL (ref 0.7–4.0)
MCH: 28.2 pg (ref 26.0–34.0)
MCHC: 31.7 g/dL (ref 30.0–36.0)
MCV: 89 fL (ref 80.0–100.0)
Monocytes Absolute: 0.5 10*3/uL (ref 0.1–1.0)
Monocytes Relative: 10 %
Neutro Abs: 2.5 10*3/uL (ref 1.7–7.7)
Neutrophils Relative %: 54 %
Platelets: 158 10*3/uL (ref 150–400)
RBC: 4.29 MIL/uL (ref 4.22–5.81)
RDW: 12.9 % (ref 11.5–15.5)
WBC: 4.7 10*3/uL (ref 4.0–10.5)
nRBC: 0 % (ref 0.0–0.2)

## 2023-11-19 LAB — COMPREHENSIVE METABOLIC PANEL WITH GFR
ALT: 11 U/L (ref 0–44)
AST: 15 U/L (ref 15–41)
Albumin: 3.4 g/dL — ABNORMAL LOW (ref 3.5–5.0)
Alkaline Phosphatase: 87 U/L (ref 38–126)
Anion gap: 9 (ref 5–15)
BUN: 24 mg/dL — ABNORMAL HIGH (ref 8–23)
CO2: 25 mmol/L (ref 22–32)
Calcium: 8.9 mg/dL (ref 8.9–10.3)
Chloride: 102 mmol/L (ref 98–111)
Creatinine, Ser: 1.37 mg/dL — ABNORMAL HIGH (ref 0.61–1.24)
GFR, Estimated: 53 mL/min — ABNORMAL LOW (ref >60)
Glucose, Bld: 184 mg/dL — ABNORMAL HIGH (ref 70–99)
Potassium: 4.2 mmol/L (ref 3.5–5.1)
Sodium: 136 mmol/L (ref 135–145)
Total Bilirubin: 0.7 mg/dL (ref 0.0–1.2)
Total Protein: 6.8 g/dL (ref 6.5–8.1)

## 2023-11-19 LAB — URINALYSIS, W/ REFLEX TO CULTURE (INFECTION SUSPECTED)
Bilirubin Urine: NEGATIVE
Glucose, UA: NEGATIVE mg/dL
Hgb urine dipstick: NEGATIVE
Ketones, ur: NEGATIVE mg/dL
Leukocytes,Ua: NEGATIVE
Nitrite: NEGATIVE
Protein, ur: NEGATIVE mg/dL
Specific Gravity, Urine: 1.006 (ref 1.005–1.030)
pH: 5 (ref 5.0–8.0)

## 2023-11-19 LAB — PROTIME-INR
INR: 1 (ref 0.8–1.2)
Prothrombin Time: 13.2 s (ref 11.4–15.2)

## 2023-11-19 LAB — I-STAT CG4 LACTIC ACID, ED: Lactic Acid, Venous: 1 mmol/L (ref 0.5–1.9)

## 2023-11-19 NOTE — ED Provider Triage Note (Signed)
 Emergency Medicine Provider Triage Evaluation Note  Wesley FAUTH Sr. , a 78 y.o. male  was evaluated in triage.  Pt complains of right leg erythema and swelling.  Patient with history of cellulitis of the right lower leg.  He states he completed his antibiotics with no improvement.  He denies systemic symptoms.  Review of Systems  Positive:  Negative:   Physical Exam  BP (!) 190/84 (BP Location: Right Arm)   Pulse 81   Temp 98.8 F (37.1 C)   Resp 18   Ht 5' 11 (1.803 m)   Wt 102.2 kg   SpO2 99%   BMI 31.42 kg/m  Gen:   Awake, no distress   Resp:  Normal effort  MSK:   Moves extremities without difficulty  Other:    Medical Decision Making  Medically screening exam initiated at 10:55 PM.  Appropriate orders placed.  Wesley JULIANNA Sprague Sr. was informed that the remainder of the evaluation will be completed by another provider, this initial triage assessment does not replace that evaluation, and the importance of remaining in the ED until their evaluation is complete.     Wesley Ubaldo NOVAK, PA-C 11/19/23 2259

## 2023-11-19 NOTE — ED Triage Notes (Signed)
 The pt has had rt lower leg pain for 4 weeks he has been treated for cellulitis but his med ran out.   The swelling has increased with more redness to his rt lower leg  no known temp

## 2023-11-20 ENCOUNTER — Emergency Department (HOSPITAL_COMMUNITY)

## 2023-11-20 DIAGNOSIS — M7989 Other specified soft tissue disorders: Secondary | ICD-10-CM | POA: Diagnosis not present

## 2023-11-20 MED ORDER — DEXTROSE 5 % IV SOLN
1500.0000 mg | Freq: Once | INTRAVENOUS | Status: AC
  Administered 2023-11-20: 1500 mg via INTRAVENOUS
  Filled 2023-11-20: qty 75

## 2023-11-20 NOTE — ED Provider Notes (Signed)
 [place] EMERGENCY DEPARTMENT AT [facility] Provider Note   CSN: [id number] Arrival date & time: 11/19/23  2129     Patient presents with: rt leg cellulitis   [name] Sr. is a 78 y.o. male.   78 year old male with history of right leg cellulitis, venous stasis and lymphedema presents with worsening right leg swelling and pain.  Patient seen in this ER on 11/01/2023, provided with Dalvance .  Follow-up with ID clinic on 616 and provided with course of Duricef which she completed.  Patient felt the leg was improving as he finished the Duricef however he completed the course while the infection was still not completely cleared and now has worsening of symptoms.  Denies fever.  No other complaints or concerns.       Prior to Admission medications   Medication Sig Start Date End Date Taking? Authorizing Provider  aspirin 325 MG tablet Take 325 mg by mouth daily. Patient not taking: Reported on 11/07/2023    [provider]  cefadroxil (DURICEF) 500 MG capsule Take 1 capsule (500 mg total) by mouth 2 (two) times daily. 11/07/23   Calone, Gregory D, FNP  ciprofloxacin  (CIPRO ) 250 MG tablet Take 250 mg by mouth 2 (two) times daily. Patient not taking: Reported on 11/07/2023    [provider]  finasteride (PROSCAR) 5 MG tablet Take 5 mg by mouth daily.    [provider]  fish oil-omega-3 fatty acids 1000 MG capsule Take 2 g by mouth daily. Patient not taking: Reported on 11/07/2023    [provider]  fluticasone (FLONASE) 50 MCG/ACT nasal spray Place 2 sprays into both nostrils daily. Patient not taking: Reported on 11/07/2023    [provider]  glipiZIDE (GLUCOTROL XL) 5 MG 24 hr tablet Take 5 mg by mouth daily with breakfast. Patient not taking: Reported on 11/07/2023    [provider]  glipiZIDE (GLUCOTROL) 5 MG tablet Take 5 mg by mouth daily.    [provider]  lisinopril (PRINIVIL,ZESTRIL) 20 MG tablet  Take 20 mg by mouth daily.    [provider]  metFORMIN (GLUCOPHAGE) 500 MG tablet Take 1,000 mg by mouth 2 (two) times daily.     [provider]  metoprolol succinate (TOPROL-XL) 25 MG 24 hr tablet Take 25 mg by mouth daily.    [provider]  nebivolol (BYSTOLIC) 5 MG tablet Take 5 mg by mouth daily.    [provider]  pantoprazole (PROTONIX) 40 MG tablet Take 40 mg by mouth daily. Patient not taking: Reported on 11/07/2023    [provider]  simvastatin (ZOCOR) 20 MG tablet Take 20 mg by mouth every evening.    [provider]    Allergies: Codeine    Review of Systems Negative except as per HPI Updated Vital Signs BP (!) 167/88 (BP Location: Right Arm)   Pulse 73   Temp 98.3 F (36.8 C) (Oral)   Resp 17   Ht 5' 11 (1.803 m)   Wt 102.2 kg   SpO2 100%   BMI 31.42 kg/m   Physical Exam Vitals and nursing note reviewed.  Constitutional:      General: He is not in acute distress.    Appearance: He is well-developed. He is not diaphoretic.  HENT:     Head: Normocephalic and atraumatic.   Cardiovascular:     Pulses: Normal pulses.  Pulmonary:     Effort: Pulmonary effort is normal.   Musculoskeletal:  General: Swelling and tenderness present.     Comments: Swelling right lower extremity, warm to the touch, raised area of redness to anterior lower leg   Skin:    General: Skin is warm and dry.     Findings: Erythema present.   Neurological:     Mental Status: He is alert and oriented to person, place, and time.   Psychiatric:        Behavior: Behavior normal.     (all labs ordered are listed, but only abnormal results are displayed) Labs Reviewed  COMPREHENSIVE METABOLIC PANEL WITH GFR - Abnormal; Notable for the following components:      Result Value   Glucose, Bld 184 (*)    BUN 24 (*)    Creatinine, Ser 1.37 (*)    Albumin 3.4 (*)    GFR, Estimated 53 (*)    All other components within normal  limits  CBC WITH DIFFERENTIAL/PLATELET - Abnormal; Notable for the following components:   Hemoglobin 12.1 (*)    HCT 38.2 (*)    All other components within normal limits  URINALYSIS, W/ REFLEX TO CULTURE (INFECTION SUSPECTED) - Abnormal; Notable for the following components:   Color, Urine STRAW (*)    Bacteria, UA RARE (*)    All other components within normal limits  CULTURE, BLOOD (ROUTINE X 2)  CULTURE, BLOOD (ROUTINE X 2)  PROTIME-INR  I-STAT CG4 LACTIC ACID, ED    EKG: None  Radiology: DG Tibia/Fibula Right Result Date: 11/20/2023 CLINICAL DATA:  Right lower extremity swelling and cellulitis. EXAM: RIGHT TIBIA AND FIBULA - 2 VIEW COMPARISON:  Prior study and similar views 11/06/2003. FINDINGS: There is diffuse subcutaneous stranding edema continuing over the ankle and foot. There are surgical clips in the medial distal thigh and posteromedial calf as before. There is no evidence of tibial or fibular fracture, dislocation or focal pathologic process. 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 There is relatively mild arthrosis at the ankle with malleolar spurring, with a prominent noninflammatory plantar calcaneal spur. There are vascular calcifications in the femoral, popliteal and trifurcation arteries all increased from 20 years ago. IMPRESSION: 1. Diffuse subcutaneous stranding edema continuing over the ankle and foot. 2. No evidence of fracture or focal pathologic process. No soft tissue gas. 3. 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 4. Progressive vascular calcifications. Electronically Signed   By: Francis Quam M.D.   On: 11/20/2023 05:03   DG Chest Port 1 View Result Date: 11/19/2023 CLINICAL DATA:  Questionable sepsis - evaluate for abnormality EXAM: PORTABLE CHEST 1 VIEW COMPARISON:  Chest radiograph 04/09/2015 FINDINGS: Prior median sternotomy. Postsurgical change in the  left hemithorax multiple chain sutures and surgical clips. Chronic left lung volume loss. Chronic scarring in the left lung. Stable heart size and mediastinal contours. No evidence of acute airspace disease. No large pleural effusion. No pneumothorax. IMPRESSION: 1. No acute chest findings. 2. Chronic postsurgical change in the left hemithorax. Electronically Signed   By: Andrea Gasman M.D.   On: 11/19/2023 23:32     Procedures   Medications Ordered in the ED  dalbavancin (DALVANCE ) 1,500 mg in dextrose  5 % 500 mL IVPB (has no administration in time range)                                    Medical Decision Making Amount and/or Complexity of  Data Reviewed Radiology: ordered.   This patient presents to the ED for concern of right leg swelling, redness, warmth, this involves an extensive number of treatment options, and is a complaint that carries with it a high risk of complications and morbidity.  The differential diagnosis includes but not limited to cellulitis, DVT, venous stasis/lymphedema    Co morbidities / Chronic conditions that complicate the patient evaluation  DM, HTN, BPH, gout, HLD, GERD, lymphedema, venous stasis    Additional history obtained:  Additional history obtained from EMR External records from outside source obtained and reviewed including venous Doppler right lower extremity 11/01/2023 negative for DVT.  Review of ER visit from 11/01/2023 as well as visit to ID clinic on 11/07/2023.   Lab Tests:  I Ordered, and personally interpreted labs.  The pertinent results include: CBC without significant findings, specifically normal WBC.  CMP with mildly elevated creatinine at 1.37, similar to prior file.  Urinalysis without significant findings.  Lactic acid 1.0.  INR 1.0.   Imaging Studies ordered:  I ordered imaging studies including chest x-ray I independently visualized and interpreted imaging which showed no acute findings I agree with the radiologist  interpretation   Cardiac Monitoring: / EKG:  The patient was maintained on a cardiac monitor.  I personally viewed and interpreted the cardiac monitored which showed an underlying rhythm of: Sinus rhythm, rate 75    Problem List / ED Course / Critical interventions / Medication management  78 year old male returns to the ER with right leg swelling, pain, redness and warmth.  Recently diagnosed with cellulitis, treated in the ER with Dalvance , followed up with ID who put patient on a 7-day course of Duricef which he felt like was working but was only for 7 days and after he completed the antibiotic the infection came back.  He is found to have notable edema to the right lower leg which is tender.  DP pulse present, the leg is warm to the touch.  Plan is to administer Dalvance  and have patient follow-up with ID.  Will obtain right lower extremity Doppler to evaluate for DVT prior to discharge.  Care signed out at change of shift provider I ordered medication including Dalvance  I have reviewed the patients home medicines and have made adjustments as needed   Consultations Obtained:  I requested consultation with the phamracy,  and discussed lab and imaging findings as well as pertinent plan - they recommend: repeat Dalvance  and follow-up with ID.   Social Determinants of Health:  Lives at home, has PCP   Test / Admission - Considered:  Dispo pending at time of sign out to oncoming provider.       Final diagnoses:  Edema of right lower leg  Cellulitis of right lower extremity    ED Discharge Orders          Ordered    Ambulatory referral to Infectious Disease       Comments: Cellulitis patient:  Received dalbavancin on 11/20/2023.   11/20/23 0529               Beverley Leita LABOR, PA-C 11/20/23 0550    Carita Senior, MD 11/20/23 [phone]

## 2023-11-20 NOTE — ED Provider Notes (Signed)
 R/o DVT, otherwise safe for discharge home.  Physical Exam  BP (!) 167/88 (BP Location: Right Arm)   Pulse 73   Temp 98.3 F (36.8 C) (Oral)   Resp 17   Ht 5' 11 (1.803 m)   Wt 102.2 kg   SpO2 100%   BMI 31.42 kg/m   Physical Exam  Procedures  Procedures  ED Course / MDM    Medical Decision Making Amount and/or Complexity of Data Reviewed Radiology: ordered.   Ultrasound negative for DVT.  I discussed findings with patient.  All questions answered. Give Dalvance  in the ED per ID. Ambulatory referral sent from previous provider for patient to follow-up with ID.  He is stable and safe for discharge home. Return precautions provided.    Waddell Sluder, PA-C 11/20/23 1051    Freddi Hamilton, MD 11/20/23 [phone]

## 2023-11-20 NOTE — Progress Notes (Signed)
 VASCULAR LAB    Right lower extremity venous duplex has been performed.  See CV proc for preliminary results.  Relayed negative results to Darryle Birmingham, PA-C via secure chat  RACHEL PELLET, RVT 11/20/2023, 9:21 AM

## 2023-11-20 NOTE — Discharge Instructions (Signed)
 As discussed, your imaging is reassuring.  There is no acute findings in the lower extremity.  There is no blood clot in the leg.  Your symptoms appear to be due to cellulitis - you were given Dalvance  in the ED to help with symptoms.  Follow-up with infectious disease for further evaluation.  Return to the ED if symptoms worsen in the interim.

## 2023-11-20 NOTE — ED Notes (Signed)
 ED Provider at bedside.

## 2023-11-22 NOTE — Progress Notes (Unsigned)
 Subjective:   Patient ID: Wesley JULIANNA Sprague Sr., male    DOB: 1945-12-28, 78 y.o.   MRN: [id number]  No chief complaint on file.   HPI:  Wesley PRETTYMAN Sr. is a 78 y.o. male with cellulitis s/p Dalvance  administration was last seen on 11/07/23 with continued warmth around the ankle without pain. Advised to elevate leg and given prescription for Cefadroxil 500 mg PO bid  if symptoms did not improve. Was seen in the ED on 11/19/23 with right lower extremity redness, swelling and pain that worsened after completion of Cefadroxil. Was given additional course of Dalvance  and presents today for follow up.    Allergies  Allergen Reactions   Codeine Hives      Outpatient Medications Prior to Visit  Medication Sig Dispense Refill   aspirin 325 MG tablet Take 325 mg by mouth daily. (Patient not taking: Reported on 11/07/2023)     cefadroxil (DURICEF) 500 MG capsule Take 1 capsule (500 mg total) by mouth 2 (two) times daily. 14 capsule 0   ciprofloxacin  (CIPRO ) 250 MG tablet Take 250 mg by mouth 2 (two) times daily. (Patient not taking: Reported on 11/07/2023)     finasteride (PROSCAR) 5 MG tablet Take 5 mg by mouth daily.     fish oil-omega-3 fatty acids 1000 MG capsule Take 2 g by mouth daily. (Patient not taking: Reported on 11/07/2023)     fluticasone (FLONASE) 50 MCG/ACT nasal spray Place 2 sprays into both nostrils daily. (Patient not taking: Reported on 11/07/2023)     glipiZIDE (GLUCOTROL XL) 5 MG 24 hr tablet Take 5 mg by mouth daily with breakfast. (Patient not taking: Reported on 11/07/2023)     glipiZIDE (GLUCOTROL) 5 MG tablet Take 5 mg by mouth daily.     lisinopril (PRINIVIL,ZESTRIL) 20 MG tablet Take 20 mg by mouth daily.     metFORMIN (GLUCOPHAGE) 500 MG tablet Take 1,000 mg by mouth 2 (two) times daily.      metoprolol succinate (TOPROL-XL) 25 MG 24 hr tablet Take 25 mg by mouth daily.     nebivolol (BYSTOLIC) 5 MG tablet Take 5 mg by mouth daily.     pantoprazole (PROTONIX) 40 MG  tablet Take 40 mg by mouth daily. (Patient not taking: Reported on 11/07/2023)     simvastatin (ZOCOR) 20 MG tablet Take 20 mg by mouth every evening.     No facility-administered medications prior to visit.     Past Medical History:  Diagnosis Date   Anemia    BPH (benign prostatic hyperplasia)    Diabetes (HCC)    GERD (gastroesophageal reflux disease)    Gout    Hyperlipidemia    Hypertension      Past Surgical History:  Procedure Laterality Date   COLONOSCOPY  03/2003   CORONARY ARTERY BYPASS GRAFT  2003   LUNG LOBECTOMY Left 2011       Review of Systems  Objective:   There were no vitals taken for this visit. Nursing note and vital signs reviewed.  Physical Exam       No data to display           Assessment & Plan:    Patient Active Problem List   Diagnosis Date Noted   Cellulitis of right lower extremity 11/07/2023   Venous stasis 11/07/2023     Problem List Items Addressed This Visit   None    I am having Wesley PHEBE Sprague Sr. maintain his metFORMIN, glipiZIDE, pantoprazole, nebivolol,  lisinopril, simvastatin, aspirin, fish oil-omega-3 fatty acids, ciprofloxacin , finasteride, glipiZIDE, metoprolol succinate, fluticasone, and cefadroxil.   No orders of the defined types were placed in this encounter.    Follow-up: No follow-ups on file. or sooner if needed.   Cathlyn July, MSN, FNP-C Nurse Practitioner [facility] for Infectious Disease [facility] Main number: [phone]

## 2023-11-23 ENCOUNTER — Encounter: Payer: Self-pay | Admitting: Family

## 2023-11-23 ENCOUNTER — Other Ambulatory Visit: Payer: Self-pay

## 2023-11-23 ENCOUNTER — Ambulatory Visit (INDEPENDENT_AMBULATORY_CARE_PROVIDER_SITE_OTHER): Admitting: Family

## 2023-11-23 VITALS — BP 163/73 | HR 87 | Temp 97.9°F | Ht 71.0 in | Wt 229.0 lb

## 2023-11-23 DIAGNOSIS — L03115 Cellulitis of right lower limb: Secondary | ICD-10-CM

## 2023-11-23 DIAGNOSIS — I878 Other specified disorders of veins: Secondary | ICD-10-CM | POA: Diagnosis not present

## 2023-11-23 MED ORDER — CEFADROXIL 500 MG PO CAPS: 500.0000 mg | ORAL_CAPSULE | Freq: Two times a day (BID) | ORAL | 0 refills | Status: AC

## 2023-11-23 NOTE — Assessment & Plan Note (Signed)
 [name] right lower extremity is improved since his last office visit and continues to have increased lower extremity edema which I do not think is related to infection given his continued improvements. No additional antibiotics are recommended at this time as the Dalvance  will continue to remain active for the next 5-7 days. Continue to elevate leg. Provided compression wrap to help with the edema. May benefit from vascular surgery evaluation or physical therapy for lymphedema. Prescription provided for Cefadroxil 500 mg PO bid in the event the cellulitis should return. Anticipate without improvement will remain at risk or recurrent cellulitis. Plan for follow up with ID as needed.

## 2023-11-23 NOTE — Assessment & Plan Note (Signed)
 Compression wrap applied to right lower extremity. Advised to continue to elevate legs as able.

## 2023-11-23 NOTE — Patient Instructions (Signed)
 Nice to see you.  Elevate your leg.   Wear compression wrap/sock.  Recommend follow up with Internal Medicine for leg swelling  Take medication at start of worsening cellutlitis.   Plan for follow up as needed.   Have a great day and stay safe!

## 2023-11-24 LAB — CULTURE, BLOOD (ROUTINE X 2)
Culture: NO GROWTH
Culture: NO GROWTH

## 2023-11-29 ENCOUNTER — Ambulatory Visit: Admitting: Family

## 2023-12-15 ENCOUNTER — Telehealth: Payer: Self-pay | Admitting: Family

## 2023-12-15 NOTE — Telephone Encounter (Signed)
 [name] called to cancel his upcoming appt. He stated his leg is perfect, the redness is gone, and he feels much better. He said Thank you so much, Cathlyn!

## 2023-12-16 ENCOUNTER — Ambulatory Visit: Admitting: Family

## 2024-02-28 NOTE — Progress Notes (Signed)
 Assessment and Plan   1. DM type 2 with diabetic dyslipidemia (*) (Primary) -     POCT A1C -     POCT ACR URINE -     glipiZIDE (GLUCOTROL) 5 mg tablet; Take one tablet (5 mg dose) by mouth daily., Starting Tue 02/28/2024, Normal -     metFORMIN ER (GLUCOPHAGE-XR) 500 mg 24 hr tablet; Take two tablets (1,000 mg dose) by mouth 2 (two) times daily., Starting Tue 02/28/2024, Normal -     sitaGLIPtin (JANUVIA) 100 mg tablet; Take one tablet (100 mg dose) by mouth daily., Starting Tue 02/28/2024, Normal 2. Hypertension associated with diabetes (*) -     Blood Pressure KIT; Check blood pressure twice a day and as needed, Normal -     metoprolol succinate (TOPROL-XL) 25 mg 24 hr tablet; TAKE ONE (1) TABLET EACH DAY, Normal 3. Essential hypertension with goal blood pressure less than 130/80 -     lisinopril (PRINIVIL,ZESTRIL) 20 mg tablet; Take one tablet (20 mg dose) by mouth daily., Starting Tue 02/28/2024, Normal 4. Hyperlipidemia associated with type 2 diabetes mellitus (*) -     simvastatin (ZOCOR) 20 mg tablet; Take one tablet (20 mg dose) by mouth at bedtime., Starting Tue 02/28/2024, Normal 5. H/O heart bypass surgery -     nitroGLYCERIN (NITROSTAT) 0.4 mg SL tablet; Place one tablet (0.4 mg dose) under the tongue every 5 (five) minutes as needed for Chest pain., Starting Tue 02/28/2024, Normal 6. Type 2 diabetes mellitus without complication, without long-term current use of insulin (*) -     glipiZIDE (GLUCOTROL) 5 mg tablet; Take one tablet (5 mg dose) by mouth daily., Starting Tue 02/28/2024, Normal 7. Medicare annual wellness visit, subsequent    A1c 7.2 today Blood pressure 135/75 lisinopril and metroprolol Hyperlipidemia labs today refill simvastatin Refill nitroglycerin    Risks, benefits, and alternatives of the medications and treatment plan prescribed today were discussed, and patient expressed understanding. Plan follow-up as discussed or as needed if any worsening symptoms or  change in condition.      Subjective   [name] is a 78 y.o. (DOB 12/12/1945) male who presents for the following:    Patient presents with  . Medicare Wellness Visit    Diabetes He presents for his follow-up diabetic visit. He has type 2 diabetes mellitus. There are no hypoglycemic associated symptoms. Associated symptoms include foot paresthesias. Pertinent negatives for diabetes include no blurred vision, no chest pain, no fatigue, no foot ulcerations, no polydipsia, no polyphagia, no polyuria, no visual change, no weakness and no weight loss. Symptoms are stable. Risk factors for coronary artery disease include diabetes mellitus, dyslipidemia, hypertension and male sex.     Reviewed and updated this visit by provider: Tobacco  Allergies  Meds  Problems  Med Hx  Surg Hx  Fam Hx      Review of Systems  Constitutional:  Negative for fatigue and weight loss.  Eyes:  Negative for blurred vision.  Cardiovascular:  Negative for chest pain.  Endocrine: Negative for polydipsia, polyphagia and polyuria.  Neurological:  Negative for weakness.          Objective   Vitals:   02/28/24 0937 02/28/24 1003  BP: 146/82 135/75  Patient Position: Sitting   Pulse: 79   Temp: 98 F (36.7 C)   TempSrc: Oral   Resp: 16   Height: 5' 11 (1.803 m)   Weight: 233 lb 6.4 oz (105.9 kg)   SpO2: 98%  BMI (Calculated): 32.6     Physical Exam Vitals and nursing note reviewed.  Constitutional:      General: He is not in acute distress.    Appearance: Normal appearance. He is well-developed. He is not ill-appearing, toxic-appearing or diaphoretic.  HENT:     Head: Normocephalic and atraumatic.     Right Ear: External ear normal.     Left Ear: External ear normal.     Nose: Nose normal.  Eyes:     Pupils: Pupils are equal, round, and reactive to light.  Neck:     Thyroid: No thyromegaly.     Vascular: No carotid bruit.     Trachea: No tracheal deviation.  Cardiovascular:      Rate and Rhythm: Normal rate and regular rhythm.     Heart sounds: Normal heart sounds. No murmur heard.    No friction rub. No gallop.  Musculoskeletal:     Cervical back: Normal range of motion and neck supple.  Pulmonary:     Effort: Pulmonary effort is normal. No respiratory distress.     Breath sounds: Normal breath sounds. No wheezing.  Abdominal:     General: Bowel sounds are normal. There is no distension.     Palpations: Abdomen is soft. There is no mass.     Tenderness: There is no abdominal tenderness. There is no guarding or rebound.     Hernia: No hernia is present.  Lymphadenopathy:     Cervical: No cervical adenopathy.  Skin:    General: Skin is warm and dry.     Coloration: Skin is not pale.     Findings: No erythema or rash.  Neurological:     Mental Status: He is alert and oriented to person, place, and time.     Gait: Gait normal.     Deep Tendon Reflexes: Reflexes are normal and symmetric.  Psychiatric:        Mood and Affect: Mood normal.        Behavior: Behavior normal.        Thought Content: Thought content normal.        Judgment: Judgment normal.           Medicare AWV  [name] is a 78 y.o. male who presents for his subsequent annual wellness visit for Medicare.  Clinical documentation was reviewed and is accessible via encounter-level attachments.    Any physical exam components or additional concerns beyond the scope of the Annual Wellness Visit may be documented in a separate note within this encounter.  Medicare Required Components     Reviewed and updated this visit by provider: Tobacco  Allergies  Meds  Problems  Med Hx  Surg Hx  Fam Hx       Medicare required assessment: Future risk of substance use disorder / overdose risk of 2% is typical (1.3-20%).    Patient Care Team: Kirstin Adelberger Donata, PA-C as PCP - General (Family Medicine) Mark C Ottelin, MD (Urology) Norleen JONETTA Ku (Ophthalmology)  Vitals    Vitals:   02/28/24 [phone] 02/28/24 1003  BP: 146/82 135/75  Patient Position: Sitting   Pulse: 79   Temp: 98 F (36.7 C)   TempSrc: Oral   Resp: 16   Height: 5' 11 (1.803 m)   Weight: 233 lb 6.4 oz (105.9 kg)   SpO2: 98%   BMI (Calculated): 32.6     Disposition   1. DM type 2 with diabetic dyslipidemia (*) (Primary) -  POCT A1C -     POCT ACR URINE -     glipiZIDE (GLUCOTROL) 5 mg tablet; Take one tablet (5 mg dose) by mouth daily., Starting Tue 02/28/2024, Normal -     metFORMIN ER (GLUCOPHAGE-XR) 500 mg 24 hr tablet; Take two tablets (1,000 mg dose) by mouth 2 (two) times daily., Starting Tue 02/28/2024, Normal -     sitaGLIPtin (JANUVIA) 100 mg tablet; Take one tablet (100 mg dose) by mouth daily., Starting Tue 02/28/2024, Normal 2. Hypertension associated with diabetes (*) -     Blood Pressure KIT; Check blood pressure twice a day and as needed, Normal -     metoprolol succinate (TOPROL-XL) 25 mg 24 hr tablet; TAKE ONE (1) TABLET EACH DAY, Normal 3. Essential hypertension with goal blood pressure less than 130/80 -     lisinopril (PRINIVIL,ZESTRIL) 20 mg tablet; Take one tablet (20 mg dose) by mouth daily., Starting Tue 02/28/2024, Normal 4. Hyperlipidemia associated with type 2 diabetes mellitus (*) -     simvastatin (ZOCOR) 20 mg tablet; Take one tablet (20 mg dose) by mouth at bedtime., Starting Tue 02/28/2024, Normal 5. H/O heart bypass surgery -     nitroGLYCERIN (NITROSTAT) 0.4 mg SL tablet; Place one tablet (0.4 mg dose) under the tongue every 5 (five) minutes as needed for Chest pain., Starting Tue 02/28/2024, Normal 6. Type 2 diabetes mellitus without complication, without long-term current use of insulin (*) -     glipiZIDE (GLUCOTROL) 5 mg tablet; Take one tablet (5 mg dose) by mouth daily., Starting Tue 02/28/2024, Normal 7. Medicare annual wellness visit, subsequent   Follow up in about 6 months (around 08/28/2024) for DM, HTN, Lipids .   Health maintenance issues  were discussed with the patient.  A written plan was provided to the patient in the form of patient instructions in the After Visit Summary document.     SDoH Screening Completed SDoH screening was reviewed as documented with appropriate diagnoses added based on screening.  Time spent performing SDoH screening, documenting as appropriate, and entering intervention when necessary: 5 minutes or more

## 2024-09-21 ENCOUNTER — Encounter (INDEPENDENT_AMBULATORY_CARE_PROVIDER_SITE_OTHER): Admitting: Ophthalmology
